# Patient Record
Sex: Female | Born: 1960 | Race: White | Hispanic: No | Marital: Married | Smoking: Former smoker
Health system: Southern US, Community
[De-identification: ages and names within clinical notes are randomized; demographics above are authoritative.]

## PROBLEM LIST (undated history)

## (undated) DIAGNOSIS — G43909 Migraine, unspecified, not intractable, without status migrainosus: Secondary | ICD-10-CM

## (undated) DIAGNOSIS — F329 Major depressive disorder, single episode, unspecified: Secondary | ICD-10-CM

## (undated) DIAGNOSIS — R002 Palpitations: Secondary | ICD-10-CM

## (undated) DIAGNOSIS — F32A Depression, unspecified: Secondary | ICD-10-CM

## (undated) DIAGNOSIS — N200 Calculus of kidney: Secondary | ICD-10-CM

## (undated) DIAGNOSIS — I10 Essential (primary) hypertension: Secondary | ICD-10-CM

## (undated) HISTORY — DX: Essential (primary) hypertension: I10

## (undated) HISTORY — DX: Palpitations: R00.2

## (undated) HISTORY — DX: Depression, unspecified: F32.A

## (undated) HISTORY — DX: Major depressive disorder, single episode, unspecified: F32.9

---

## 2010-12-04 HISTORY — PX: REDUCTION MAMMAPLASTY: SUR839

## 2010-12-04 HISTORY — PX: BREAST REDUCTION SURGERY: SHX8

## 2011-12-05 HISTORY — PX: NOVASURE ABLATION: SHX5394

## 2016-06-02 ENCOUNTER — Ambulatory Visit (INDEPENDENT_AMBULATORY_CARE_PROVIDER_SITE_OTHER): Payer: 59 | Admitting: Gynecology

## 2016-06-02 ENCOUNTER — Encounter: Payer: Self-pay | Admitting: Gynecology

## 2016-06-02 VITALS — BP 128/80 | Ht 62.0 in | Wt 170.0 lb

## 2016-06-02 DIAGNOSIS — N951 Menopausal and female climacteric states: Secondary | ICD-10-CM | POA: Diagnosis not present

## 2016-06-02 DIAGNOSIS — Z01419 Encounter for gynecological examination (general) (routine) without abnormal findings: Secondary | ICD-10-CM

## 2016-06-02 NOTE — Patient Instructions (Addendum)
Hormone Therapy At menopause, your body begins making less estrogen and progesterone hormones. This causes the body to stop having menstrual periods. This is because estrogen and progesterone hormones control your periods and menstrual cycle. A lack of estrogen may cause symptoms such as:  Hot flushes (or hot flashes).  Vaginal dryness.  Dry skin.  Loss of sex drive.  Risk of bone loss (osteoporosis). When this happens, you may choose to take hormone therapy to get back the estrogen lost during menopause. When the hormone estrogen is given alone, it is usually referred to as ET (Estrogen Therapy). When the hormone progestin is combined with estrogen, it is generally called HT (Hormone Therapy). This was formerly known as hormone replacement therapy (HRT). Your caregiver can help you make a decision on what will be best for you. The decision to use HT seems to change often as new studies are done. Many studies do not agree on the benefits of hormone replacement therapy. LIKELY BENEFITS OF HT INCLUDE PROTECTION FROM:  Hot Flushes (also called hot flashes) - A hot flush is a sudden feeling of heat that spreads over the face and body. The skin may redden like a blush. It is connected with sweats and sleep disturbance. Women going through menopause may have hot flushes a few times a month or several times per day depending on the woman.  Osteoporosis (bone loss) - Estrogen helps guard against bone loss. After menopause, a woman's bones slowly lose calcium and become weak and brittle. As a result, bones are more likely to break. The hip, wrist, and spine are affected most often. Hormone therapy can help slow bone loss after menopause. Weight bearing exercise and taking calcium with vitamin D also can help prevent bone loss. There are also medications that your caregiver can prescribe that can help prevent osteoporosis.  Vaginal dryness - Loss of estrogen causes changes in the vagina. Its lining may  become thin and dry. These changes can cause pain and bleeding during sexual intercourse. Dryness can also lead to infections. This can cause burning and itching. (Vaginal estrogen treatment can help relieve pain, itching, and dryness.)  Urinary tract infections are more common after menopause because of lack of estrogen. Some women also develop urinary incontinence because of low estrogen levels in the vagina and bladder.  Possible other benefits of estrogen include a positive effect on mood and short-term memory in women. RISKS AND COMPLICATIONS  Using estrogen alone without progesterone causes the lining of the uterus to grow. This increases the risk of lining of the uterus (endometrial) cancer. Your caregiver should give another hormone called progestin if you have a uterus.  Women who take combined (estrogen and progestin) HT appear to have an increased risk of breast cancer. The risk appears to be small, but increases throughout the time that HT is taken.  Combined therapy also makes the breast tissue slightly denser which makes it harder to read mammograms (breast X-rays).  Combined, estrogen and progesterone therapy can be taken together every day, in which case there may be spotting of blood. HT therapy can be taken cyclically in which case you will have menstrual periods. Cyclically means HT is taken for a set amount of days, then not taken, then this process is repeated.  HT may increase the risk of stroke, heart attack, breast cancer and forming blood clots in your leg.  Transdermal estrogen (estrogen that is absorbed through the skin with a patch or a cream) may have better results with:  Cholesterol.  Blood pressure.  Blood clots. Having the following conditions may indicate you should not have HT:  Endometrial cancer.  Liver disease.  Breast cancer.  Heart disease.  History of blood clots.  Stroke. TREATMENT   If you choose to take HT and have a uterus, usually  estrogen and progestin are prescribed.  Your caregiver will help you decide the best way to take the medications.  Possible ways to take estrogen include:  Pills.  Patches.  Gels.  Sprays.  Vaginal estrogen cream, rings and tablets.  It is best to take the lowest dose possible that will help your symptoms and take them for the shortest period of time that you can.  Hormone therapy can help relieve some of the problems (symptoms) that affect women at menopause. Before making a decision about HT, talk to your caregiver about what is best for you. Be well informed and comfortable with your decisions. HOME CARE INSTRUCTIONS   Follow your caregivers advice when taking the medications.  A Pap test is done to screen for cervical cancer.  The first Pap test should be done at age 34.  Between ages 80 and 52, Pap tests are repeated every 2 years.  Beginning at age 13, you are advised to have a Pap test every 3 years as long as the past 3 Pap tests have been normal.  Some women have medical problems that increase the chance of getting cervical cancer. Talk to your caregiver about these problems. It is especially important to talk to your caregiver if a new problem develops soon after your last Pap test. In these cases, your caregiver may recommend more frequent screening and Pap tests.  The above recommendations are the same for women who have or have not gotten the vaccine for HPV (human papillomavirus).  If you had a hysterectomy for a problem that was not a cancer or a condition that could lead to cancer, then you no longer need Pap tests. However, even if you no longer need a Pap test, a regular exam is a good idea to make sure no other problems are starting.  If you are between ages 20 and 60, and you have had normal Pap tests going back 10 years, you no longer need Pap tests. However, even if you no longer need a Pap test, a regular exam is a good idea to make sure no other problems  are starting.  If you have had past treatment for cervical cancer or a condition that could lead to cancer, you need Pap tests and screening for cancer for at least 20 years after your treatment.  If Pap tests have been discontinued, risk factors (such as a new sexual partner)need to be re-assessed to determine if screening should be resumed.  Some women may need screenings more often if they are at high risk for cervical cancer.  Get mammograms done as per the advice of your caregiver. SEEK IMMEDIATE MEDICAL CARE IF:  You develop abnormal vaginal bleeding.  You have pain or swelling in your legs, shortness of breath, or chest pain.  You develop dizziness or headaches.  You have lumps or changes in your breasts or armpits.  You have slurred speech.  You develop weakness or numbness of your arms or legs.  You have pain, burning, or bleeding when urinating.  You develop abdominal pain.   This information is not intended to replace advice given to you by your health care provider. Make sure you discuss any questions  you have with your health care provider.   Document Released: 08/19/2003 Document Revised: 04/06/2015 Document Reviewed: 05/24/2015 Elsevier Interactive Patient Education 2016 [place]. Menopause Menopause is the normal time of life when menstrual periods stop completely. Menopause is complete when you have missed 12 consecutive menstrual periods. It usually occurs between the ages of 49 years and 71 years. Very rarely does a woman develop menopause before the age of 46 years. At menopause, your ovaries stop producing the female hormones estrogen and progesterone. This can cause undesirable symptoms and also affect your health. Sometimes the symptoms may occur 4-5 years before the menopause begins. There is no relationship between menopause and:  Oral contraceptives.  Number of children you had.  Race.  The age your menstrual periods started (menarche). Heavy  smokers and very thin women may develop menopause earlier in life. CAUSES  The ovaries stop producing the female hormones estrogen and progesterone.  Other causes include:  Surgery to remove both ovaries.  The ovaries stop functioning for no known reason.  Tumors of the pituitary gland in the brain.  Medical disease that affects the ovaries and hormone production.  Radiation treatment to the abdomen or pelvis.  Chemotherapy that affects the ovaries. SYMPTOMS   Hot flashes.  Night sweats.  Decrease in sex drive.  Vaginal dryness and thinning of the vagina causing painful intercourse.  Dryness of the skin and developing wrinkles.  Headaches.  Tiredness.  Irritability.  Memory problems.  Weight gain.  Bladder infections.  Hair growth of the face and chest.  Infertility. More serious symptoms include:  Loss of bone (osteoporosis) causing breaks (fractures).  Depression.  Hardening and narrowing of the arteries (atherosclerosis) causing heart attacks and strokes. DIAGNOSIS   When the menstrual periods have stopped for 12 straight months.  Physical exam.  Hormone studies of the blood. TREATMENT  There are many treatment choices and nearly as many questions about them. The decisions to treat or not to treat menopausal changes is an individual choice made with your health care provider. Your health care provider can discuss the treatments with you. Together, you can decide which treatment will work best for you. Your treatment choices may include:   Hormone therapy (estrogen and progesterone).  Non-hormonal medicines.  Treating the individual symptoms with medicine (for example antidepressants for depression).  Herbal medicines that may help specific symptoms.  Counseling by a psychiatrist or psychologist.  Group therapy.  Lifestyle changes including:  Eating healthy.  Regular exercise.  Limiting caffeine and alcohol.  Stress management and  meditation.  No treatment. HOME CARE INSTRUCTIONS   Take the medicine your health care provider gives you as directed.  Get plenty of sleep and rest.  Exercise regularly.  Eat a diet that contains calcium (good for the bones) and soy products (acts like estrogen hormone).  Avoid alcoholic beverages.  Do not smoke.  If you have hot flashes, dress in layers.  Take supplements, calcium, and vitamin D to strengthen bones.  You can use over-the-counter lubricants or moisturizers for vaginal dryness.  Group therapy is sometimes very helpful.  Acupuncture may be helpful in some cases. SEEK MEDICAL CARE IF:   You are not sure you are in menopause.  You are having menopausal symptoms and need advice and treatment.  You are still having menstrual periods after age 49 years.  You have pain with intercourse.  Menopause is complete (no menstrual period for 12 months) and you develop vaginal bleeding.  You need a referral to  a specialist (gynecologist, psychiatrist, or psychologist) for treatment. SEEK IMMEDIATE MEDICAL CARE IF:   You have severe depression.  You have excessive vaginal bleeding.  You fell and think you have a broken bone.  You have pain when you urinate.  You develop leg or chest pain.  You have a fast pounding heart beat (palpitations).  You have severe headaches.  You develop vision problems.  You feel a lump in your breast.  You have abdominal pain or severe indigestion.   This information is not intended to replace advice given to you by your health care provider. Make sure you discuss any questions you have with your health care provider.   Document Released: 02/10/2004 Document Revised: 07/23/2013 Document Reviewed: 06/19/2013 Elsevier Interactive Patient Education [employer].  Colonoscopy A colonoscopy is an exam to look at the entire large intestine (colon). This exam can help find problems such as tumors, polyps, inflammation, and  areas of bleeding. The exam takes about 1 hour.  LET [facility] CARE PROVIDER KNOW ABOUT:   Any allergies you have.  All medicines you are taking, including vitamins, herbs, eye drops, creams, and over-the-counter medicines.  Previous problems you or members of your family have had with the use of anesthetics.  Any blood disorders you have.  Previous surgeries you have had.  Medical conditions you have. RISKS AND COMPLICATIONS  Generally, this is a safe procedure. However, as with any procedure, complications can occur. Possible complications include:  Bleeding.  Tearing or rupture of the colon wall.  Reaction to medicines given during the exam.  Infection (rare). BEFORE THE PROCEDURE   Ask your health care provider about changing or stopping your regular medicines.  You may be prescribed an oral bowel prep. This involves drinking a large amount of medicated liquid, starting the day before your procedure. The liquid will cause you to have multiple loose stools until your stool is almost clear or light green. This cleans out your colon in preparation for the procedure.  Do not eat or drink anything else once you have started the bowel prep, unless your health care provider tells you it is safe to do so.  Arrange for someone to drive you home after the procedure. PROCEDURE   You will be given medicine to help you relax (sedative).  You will lie on your side with your knees bent.  A long, flexible tube with a light and camera on the end (colonoscope) will be inserted through the rectum and into the colon. The camera sends video back to a computer screen as it moves through the colon. The colonoscope also releases carbon dioxide gas to inflate the colon. This helps your health care provider see the area better.  During the exam, your health care provider may take a small tissue sample (biopsy) to be examined under a microscope if any abnormalities are found.  The exam is  finished when the entire colon has been viewed. AFTER THE PROCEDURE   Do not drive for 24 hours after the exam.  You may have a small amount of blood in your stool.  You may pass moderate amounts of gas and have mild abdominal cramping or bloating. This is caused by the gas used to inflate your colon during the exam.  Ask when your test results will be ready and how you will get your results. Make sure you get your test results.   This information is not intended to replace advice given to you by your health  care provider. Make sure you discuss any questions you have with your health care provider.   Document Released: 11/17/2000 Document Revised: 09/10/2013 Document Reviewed: 07/28/2013 Elsevier Interactive Patient Education [employer].

## 2016-06-02 NOTE — Progress Notes (Signed)
Ariel Lopez [birth year] [id number]   History:    55 y.o.  for annual gyn exam who presented to the office today stating that she has been complaining hot flashes, mood swings and irritability. She has moved to [facility] from [place] where she had her last gynecological exam over year ago. She reports that she had a NovaSure endometrial ablation for her menorrhagia back in 2013 and has not had a cycle since then. She was also following a holistic medicine physician in [place] and recently had seen Dr. Sharol Roussel who recently did a battery of tests and hormone level testing which we do not have the report. She reports her last normal mammogram in 2015. She has not had a colonoscopy as of yet. She reports a mother and sister with colorectal cancer and a sister with cervical cancer at the age of 61. Also 2 aunts with history of breast cancer.  Past medical history,surgical history, family history and social history were all reviewed and documented in the EPIC chart.  Gynecologic History No LMP recorded. Patient has had an ablation. Contraception: vasectomy Last Pap: 2015. Results were: normal Last mammogram: 2015. Results were: normal  Obstetric History OB History  Gravida Para Term Preterm AB SAB TAB Ectopic Multiple Living  2       0  2    # Outcome Date GA Lbr Len/2nd Weight Sex Delivery Anes PTL Lv  2 Gravida           1 Gravida                ROS: A ROS was performed and pertinent positives and negatives are included in the history.  GENERAL: No fevers or chills. HEENT: No change in vision, no earache, sore throat or sinus congestion. NECK: No pain or stiffness. CARDIOVASCULAR: No chest pain or pressure. No palpitations. PULMONARY: No shortness of breath, cough or wheeze. GASTROINTESTINAL: No abdominal pain, nausea, vomiting or diarrhea, melena or bright red blood per rectum. GENITOURINARY: No urinary frequency, urgency, hesitancy or dysuria. MUSCULOSKELETAL: No  joint or muscle pain, no back pain, no recent trauma. DERMATOLOGIC: No rash, no itching, no lesions. ENDOCRINE: No polyuria, polydipsia, no heat or cold intolerance. No recent change in weight. HEMATOLOGICAL: No anemia or easy bruising or bleeding. NEUROLOGIC: No headache, seizures, numbness, tingling or weakness. PSYCHIATRIC: No depression, no loss of interest in normal activity or change in sleep pattern.     Exam: chaperone present  BP 128/80 mmHg  Ht 5\' 2"  (1.575 m)  Wt 170 lb (77.111 kg)  BMI 31.09 kg/m2  Body mass index is 31.09 kg/(m^2).  General appearance : Well developed well nourished female. No acute distress HEENT: Eyes: no retinal hemorrhage or exudates,  Neck supple, trachea midline, no carotid bruits, no thyroidmegaly Lungs: Clear to auscultation, no rhonchi or wheezes, or rib retractions  Heart: Regular rate and rhythm, no murmurs or gallops Breast:Examined in sitting and supine position were symmetrical in appearance, no palpable masses or tenderness,  no skin retraction, no nipple inversion, no nipple discharge, no skin discoloration, no axillary or supraclavicular lymphadenopathy Abdomen: no palpable masses or tenderness, no rebound or guarding Extremities: no edema or skin discoloration or tenderness  Pelvic:  Bartholin, Urethra, Skene Glands: Within normal limits             Vagina: No gross lesions or discharge  Cervix: No gross lesions or discharge  Uterus  anteverted, normal size, shape and consistency, non-tender and mobile  Adnexa  Without masses or tenderness  Anus and perineum  normal   Rectovaginal  normal sphincter tone without palpated masses or tenderness             Hemoccult will schedule colonoscopy this year     Assessment/Plan:  55 y.o. female for annual exam who signs and symptoms are suspicious for being menopausal at the age of 45. Since her holistic medicine physician has recently done a battery of blood tests including hormone levels no  blood tests will be done today. I've asked her to send [place] a copy of those lab results so that we can review and scanned in place and her records. We discussed importance of calcium vitamin D and weightbearing exercises for osteoporosis prevention. Pap smear with HPV screening was done today. And requisition to schedule her overdue mammogram was provided. Also I provided with names of community gastroenterologist for her to schedule an appointment.   Terrance Mass MD, 11:41 AM 06/02/2016

## 2016-06-07 LAB — PAP, TP IMAGING W/ HPV RNA, RFLX HPV TYPE 16,18/45: HPV mRNA, High Risk: NOT DETECTED

## 2016-09-22 ENCOUNTER — Other Ambulatory Visit: Payer: Self-pay | Admitting: Gynecology

## 2016-09-22 DIAGNOSIS — Z1231 Encounter for screening mammogram for malignant neoplasm of breast: Secondary | ICD-10-CM

## 2016-10-06 ENCOUNTER — Ambulatory Visit
Admission: RE | Admit: 2016-10-06 | Discharge: 2016-10-06 | Disposition: A | Discharge status: Home / Self Care | Payer: 59 | Source: Ambulatory Visit | Attending: Gynecology | Admitting: Gynecology

## 2016-10-06 DIAGNOSIS — Z1231 Encounter for screening mammogram for malignant neoplasm of breast: Secondary | ICD-10-CM

## 2017-02-08 DIAGNOSIS — Z0000 Encounter for general adult medical examination without abnormal findings: Secondary | ICD-10-CM | POA: Diagnosis not present

## 2017-02-08 DIAGNOSIS — Z832 Family history of diseases of the blood and blood-forming organs and certain disorders involving the immune mechanism: Secondary | ICD-10-CM | POA: Diagnosis not present

## 2017-02-08 LAB — BASIC METABOLIC PANEL
BUN: 9 (ref 4–21)
CREATININE: 0.6 (ref 0.5–1.1)
GLUCOSE: 96
POTASSIUM: 4.7 (ref 3.4–5.3)
SODIUM: 143 (ref 137–147)

## 2017-02-08 LAB — CBC AND DIFFERENTIAL
HCT: 47 — AB (ref 36–46)
Hemoglobin: 16.5 — AB (ref 12.0–16.0)
Platelets: 227 (ref 150–399)
WBC: 4.7

## 2017-02-08 LAB — LIPID PANEL
Cholesterol: 166 (ref 0–200)
HDL: 66 (ref 35–70)
LDL CALC: 78
Triglycerides: 106 (ref 40–160)

## 2017-02-08 LAB — HEPATIC FUNCTION PANEL
ALT: 26 (ref 7–35)
AST: 23 (ref 13–35)
Alkaline Phosphatase: 73 (ref 25–125)

## 2017-02-08 LAB — TSH: TSH: 0.58 (ref 0.41–5.90)

## 2017-03-15 DIAGNOSIS — K9041 Non-celiac gluten sensitivity: Secondary | ICD-10-CM | POA: Diagnosis not present

## 2017-03-15 DIAGNOSIS — Z1211 Encounter for screening for malignant neoplasm of colon: Secondary | ICD-10-CM | POA: Diagnosis not present

## 2017-03-15 DIAGNOSIS — Z800 Family history of malignant neoplasm of digestive organs: Secondary | ICD-10-CM | POA: Diagnosis not present

## 2017-03-15 DIAGNOSIS — Z7689 Persons encountering health services in other specified circumstances: Secondary | ICD-10-CM | POA: Diagnosis not present

## 2017-03-20 DIAGNOSIS — D485 Neoplasm of uncertain behavior of skin: Secondary | ICD-10-CM | POA: Diagnosis not present

## 2017-03-20 DIAGNOSIS — D225 Melanocytic nevi of trunk: Secondary | ICD-10-CM | POA: Diagnosis not present

## 2017-03-20 DIAGNOSIS — D2261 Melanocytic nevi of right upper limb, including shoulder: Secondary | ICD-10-CM | POA: Diagnosis not present

## 2017-03-20 DIAGNOSIS — D2272 Melanocytic nevi of left lower limb, including hip: Secondary | ICD-10-CM | POA: Diagnosis not present

## 2017-03-20 DIAGNOSIS — D2262 Melanocytic nevi of left upper limb, including shoulder: Secondary | ICD-10-CM | POA: Diagnosis not present

## 2017-04-09 DIAGNOSIS — D582 Other hemoglobinopathies: Secondary | ICD-10-CM | POA: Diagnosis not present

## 2017-04-09 DIAGNOSIS — Z8639 Personal history of other endocrine, nutritional and metabolic disease: Secondary | ICD-10-CM | POA: Diagnosis not present

## 2017-04-09 DIAGNOSIS — R002 Palpitations: Secondary | ICD-10-CM | POA: Diagnosis not present

## 2017-04-09 DIAGNOSIS — R899 Unspecified abnormal finding in specimens from other organs, systems and tissues: Secondary | ICD-10-CM | POA: Diagnosis not present

## 2017-04-18 ENCOUNTER — Encounter: Payer: Self-pay | Admitting: Gynecology

## 2017-07-04 DIAGNOSIS — Z1211 Encounter for screening for malignant neoplasm of colon: Secondary | ICD-10-CM | POA: Diagnosis not present

## 2017-07-04 DIAGNOSIS — K635 Polyp of colon: Secondary | ICD-10-CM | POA: Diagnosis not present

## 2017-07-04 DIAGNOSIS — Z800 Family history of malignant neoplasm of digestive organs: Secondary | ICD-10-CM | POA: Diagnosis not present

## 2017-07-04 DIAGNOSIS — D124 Benign neoplasm of descending colon: Secondary | ICD-10-CM | POA: Diagnosis not present

## 2017-08-10 ENCOUNTER — Ambulatory Visit (INDEPENDENT_AMBULATORY_CARE_PROVIDER_SITE_OTHER): Payer: 59 | Admitting: Obstetrics & Gynecology

## 2017-08-10 ENCOUNTER — Telehealth: Payer: Self-pay | Admitting: *Deleted

## 2017-08-10 ENCOUNTER — Encounter: Payer: Self-pay | Admitting: Obstetrics & Gynecology

## 2017-08-10 VITALS — BP 146/90

## 2017-08-10 DIAGNOSIS — N951 Menopausal and female climacteric states: Secondary | ICD-10-CM | POA: Diagnosis not present

## 2017-08-10 DIAGNOSIS — E6609 Other obesity due to excess calories: Secondary | ICD-10-CM | POA: Diagnosis not present

## 2017-08-10 DIAGNOSIS — Z6831 Body mass index (BMI) 31.0-31.9, adult: Secondary | ICD-10-CM | POA: Diagnosis not present

## 2017-08-10 NOTE — Telephone Encounter (Signed)
Pt was seen today and Rx was never sent for Estradiol 0.05 patch and Micronized Progesterone 100 mg PO HS. What estradiol 0.05 mg patch would you like patient to have biweekly or weekly patch? Please advise

## 2017-08-10 NOTE — Patient Instructions (Signed)
1. Menopause syndrome Very symptomatic menopause.  No CI to HRT.  Benefits vs Risks of HRT discussed thoroughly with patient.  Benefits include control of Vasomotor symptoms, prevention of Atrophic vaginitis, slowing down Bone loss, prevention of MI, skin, mood, libido.  Risks include increased risk of blood clots/stroke and after 10 years of use, increased risk of breast Ca.  Patient understands the risks/benefits and opts to start on HRT.  Forms of HRT discussed, recommend Estradiol patch at the lowest dosage that will help patient with her symptoms, will start with Estradiol 0.05 patch and Micronized Progesterone 100 mg PO HS to protect her Uterine lining.  Schedule Bone Density.  Continue with Vit D supplements/Ca++ in nutrition, weight bearing physical activity.  2. Class 1 obesity due to excess calories without serious comorbidity with body mass index (BMI) of 31.0 to 31.9 in adult Low calorie/low carb diet discussed as well as regular physical activity.  Recommend Cardio and weight lifting.  Amiee, it was a pleasure to meet you today!  I will see you again soon at your Annual/Gyn exam.   Exercising to Lose Weight Exercising can help you to lose weight. In order to lose weight through exercise, you need to do vigorous-intensity exercise. You can tell that you are exercising with vigorous intensity if you are breathing very hard and fast and cannot hold a conversation while exercising. Moderate-intensity exercise helps to maintain your current weight. You can tell that you are exercising at a moderate level if you have a higher heart rate and faster breathing, but you are still able to hold a conversation. How often should I exercise? Choose an activity that you enjoy and set realistic goals. Your health care provider can help you to make an activity plan that works for you. Exercise regularly as directed by your health care provider. This may include:  Doing resistance training twice each week,  such as: ? Push-ups. ? Sit-ups. ? Lifting weights. ? Using resistance bands.  Doing a given intensity of exercise for a given amount of time. Choose from these options: ? 150 minutes of moderate-intensity exercise every week. ? 75 minutes of vigorous-intensity exercise every week. ? A mix of moderate-intensity and vigorous-intensity exercise every week.  Children, pregnant women, people who are out of shape, people who are overweight, and older adults may need to consult a health care provider for individual recommendations. If you have any sort of medical condition, be sure to consult your health care provider before starting a new exercise program. What are some activities that can help me to lose weight?  Walking at a rate of at least 4.5 miles an hour.  Jogging or running at a rate of 5 miles per hour.  Biking at a rate of at least 10 miles per hour.  Lap swimming.  Roller-skating or in-line skating.  Cross-country skiing.  Vigorous competitive sports, such as football, basketball, and soccer.  Jumping rope.  Aerobic dancing. How can I be more active in my day-to-day activities?  Use the stairs instead of the elevator.  Take a walk during your lunch break.  If you drive, park your car farther away from work or school.  If you take public transportation, get off one stop early and walk the rest of the way.  Make all of your phone calls while standing up and walking around.  Get up, stretch, and walk around every 30 minutes throughout the day. What guidelines should I follow while exercising?  Do not exercise  so much that you hurt yourself, feel dizzy, or get very short of breath.  Consult your health care provider prior to starting a new exercise program.  Wear comfortable clothes and shoes with good support.  Drink plenty of water while you exercise to prevent dehydration or heat stroke. Body water is lost during exercise and must be replaced.  Work out until  you breathe faster and your heart beats faster. This information is not intended to replace advice given to you by your health care provider. Make sure you discuss any questions you have with your health care provider. Document Released: 12/23/2010 Document Revised: 04/27/2016 Document Reviewed: 04/23/2014 Elsevier Interactive Patient Education  2018 [place] for [employer] Calories are units of energy. Your body needs a certain amount of calories from food to keep you going throughout the day. When you eat more calories than your body needs, your body stores the extra calories as fat. When you eat fewer calories than your body needs, your body burns fat to get the energy it needs. Calorie counting means keeping track of how many calories you eat and drink each day. Calorie counting can be helpful if you need to lose weight. If you make sure to eat fewer calories than your body needs, you should lose weight. Ask your health care provider what a healthy weight is for you. For calorie counting to work, you will need to eat the right number of calories in a day in order to lose a healthy amount of weight per week. A dietitian can help you determine how many calories you need in a day and will give you suggestions on how to reach your calorie goal.  A healthy amount of weight to lose per week is usually 1-2 lb (0.5-0.9 kg). This usually means that your daily calorie intake should be reduced by 500-750 calories.  Eating 1,200 - 1,500 calories per day can help most women lose weight.  Eating 1,500 - 1,800 calories per day can help most men lose weight.  What is my plan? My goal is to have __________ calories per day. If I have this many calories per day, I should lose around __________ pounds per week. What do I need to know about calorie counting? In order to meet your daily calorie goal, you will need to:  Find out how many calories are in each food you would like to eat.  Try to do this before you eat.  Decide how much of the food you plan to eat.  Write down what you ate and how many calories it had. Doing this is called keeping a food log.  To successfully lose weight, it is important to balance calorie counting with a healthy lifestyle that includes regular activity. Aim for 150 minutes of moderate exercise (such as walking) or 75 minutes of vigorous exercise (such as running) each week. Where do I find calorie information?  The number of calories in a food can be found on a Nutrition Facts label. If a food does not have a Nutrition Facts label, try to look up the calories online or ask your dietitian for help. Remember that calories are listed per serving. If you choose to have more than one serving of a food, you will have to multiply the calories per serving by the amount of servings you plan to eat. For example, the label on a package of bread might say that a serving size is 1 slice and that there are  90 calories in a serving. If you eat 1 slice, you will have eaten 90 calories. If you eat 2 slices, you will have eaten 180 calories. How do I keep a food log? Immediately after each meal, record the following information in your food log:  What you ate. Don't forget to include toppings, sauces, and other extras on the food.  How much you ate. This can be measured in cups, ounces, or number of items.  How many calories each food and drink had.  The total number of calories in the meal.  Keep your food log near you, such as in a small notebook in your pocket, or use a mobile app or website. Some programs will calculate calories for you and show you how many calories you have left for the day to meet your goal. What are some calorie counting tips?  Use your calories on foods and drinks that will fill you up and not leave you hungry: ? Some examples of foods that fill you up are nuts and nut butters, vegetables, lean proteins, and high-fiber foods like  whole grains. High-fiber foods are foods with more than 5 g fiber per serving. ? Drinks such as sodas, specialty coffee drinks, alcohol, and juices have a lot of calories, yet do not fill you up.  Eat nutritious foods and avoid empty calories. Empty calories are calories you get from foods or beverages that do not have many vitamins or protein, such as candy, sweets, and soda. It is better to have a nutritious high-calorie food (such as an avocado) than a food with few nutrients (such as a bag of chips).  Know how many calories are in the foods you eat most often. This will help you calculate calorie counts faster.  Pay attention to calories in drinks. Low-calorie drinks include water and unsweetened drinks.  Pay attention to nutrition labels for "low fat" or "fat free" foods. These foods sometimes have the same amount of calories or more calories than the full fat versions. They also often have added sugar, starch, or salt, to make up for flavor that was removed with the fat.  Find a way of tracking calories that works for you. Get creative. Try different apps or programs if writing down calories does not work for you. What are some portion control tips?  Know how many calories are in a serving. This will help you know how many servings of a certain food you can have.  Use a measuring cup to measure serving sizes. You could also try weighing out portions on a kitchen scale. With time, you will be able to estimate serving sizes for some foods.  Take some time to put servings of different foods on your favorite plates, bowls, and cups so you know what a serving looks like.  Try not to eat straight from a bag or box. Doing this can lead to overeating. Put the amount you would like to eat in a cup or on a plate to make sure you are eating the right portion.  Use smaller plates, glasses, and bowls to prevent overeating.  Try not to multitask (for example, watch TV or use your computer) while  eating. If it is time to eat, sit down at a table and enjoy your food. This will help you to know when you are full. It will also help you to be aware of what you are eating and how much you are eating. What are tips for following this plan? Reading  food labels  Check the calorie count compared to the serving size. The serving size may be smaller than what you are used to eating.  Check the source of the calories. Make sure the food you are eating is high in vitamins and protein and low in saturated and trans fats. Shopping  Read nutrition labels while you shop. This will help you make healthy decisions before you decide to purchase your food.  Make a grocery list and stick to it. Cooking  Try to cook your favorite foods in a healthier way. For example, try baking instead of frying.  Use low-fat dairy products. Meal planning  Use more fruits and vegetables. Half of your plate should be fruits and vegetables.  Include lean proteins like poultry and fish. How do I count calories when eating out?  Ask for smaller portion sizes.  Consider sharing an entree and sides instead of getting your own entree.  If you get your own entree, eat only half. Ask for a box at the beginning of your meal and put the rest of your entree in it so you are not tempted to eat it.  If calories are listed on the menu, choose the lower calorie options.  Choose dishes that include vegetables, fruits, whole grains, low-fat dairy products, and lean protein.  Choose items that are boiled, broiled, grilled, or steamed. Stay away from items that are buttered, battered, fried, or served with cream sauce. Items labeled "crispy" are usually fried, unless stated otherwise.  Choose water, low-fat milk, unsweetened iced tea, or other drinks without added sugar. If you want an alcoholic beverage, choose a lower calorie option such as a glass of wine or light beer.  Ask for dressings, sauces, and syrups on the side.  These are usually high in calories, so you should limit the amount you eat.  If you want a salad, choose a garden salad and ask for grilled meats. Avoid extra toppings like bacon, cheese, or fried items. Ask for the dressing on the side, or ask for olive oil and vinegar or lemon to use as dressing.  Estimate how many servings of a food you are given. For example, a serving of cooked rice is  cup or about the size of half a baseball. Knowing serving sizes will help you be aware of how much food you are eating at restaurants. The list below tells you how big or small some common portion sizes are based on everyday objects: ? 1 oz-4 stacked dice. ? 3 oz-1 deck of cards. ? 1 tsp-1 die. ? 1 Tbsp- a ping-pong ball. ? 2 Tbsp-1 ping-pong ball. ?  cup- baseball. ? 1 cup-1 baseball. Summary  Calorie counting means keeping track of how many calories you eat and drink each day. If you eat fewer calories than your body needs, you should lose weight.  A healthy amount of weight to lose per week is usually 1-2 lb (0.5-0.9 kg). This usually means reducing your daily calorie intake by 500-750 calories.  The number of calories in a food can be found on a Nutrition Facts label. If a food does not have a Nutrition Facts label, try to look up the calories online or ask your dietitian for help.  Use your calories on foods and drinks that will fill you up, and not on foods and drinks that will leave you hungry.  Use smaller plates, glasses, and bowls to prevent overeating. This information is not intended to replace advice given to you by  your health care provider. Make sure you discuss any questions you have with your health care provider. Document Released: 11/20/2005 Document Revised: 10/20/2016 Document Reviewed: 10/20/2016 Elsevier Interactive Patient Education  2017 [place].  Menopause and Hormone Replacement Therapy What is hormone replacement therapy? Hormone replacement therapy (HRT) is the  use of artificial (synthetic) hormones to replace hormones that your body stops producing during menopause. Menopause is the normal time of life when menstrual periods stop completely and the ovaries stop producing the female hormones estrogen and progesterone. This lack of hormones can affect your health and cause undesirable symptoms. HRT can relieve some of those symptoms. What are my options for HRT? HRT may consist of the synthetic hormones estrogen and progestin, or it may consist of only estrogen (estrogen-only therapy). You and your health care provider will decide which form of HRT is best for you. If you choose to be on HRT and you have a uterus, estrogen and progestin are usually prescribed. Estrogen-only therapy is used for women who do not have a uterus. Possible options for taking HRT include:  Pills.  Patches.  Gels.  Sprays.  Vaginal cream.  Vaginal rings.  Vaginal inserts.  The amount of hormone(s) that you take and how long you take the hormone(s) varies depending on your individual health. It is important to:  Begin HRT with the lowest possible dosage.  Stop HRT as soon as your health care provider tells you to stop.  Work with your health care provider so that you feel informed and comfortable with your decisions.  What are the benefits of HRT? HRT can reduce the frequency and severity of menopausal symptoms. Benefits of HRT vary depending on the menopausal symptoms that you have, the severity of your symptoms, and your overall health. HRT may help to improve the following menopausal symptoms:  Hot flashes and night sweats. These are sudden feelings of heat that spread over the face and body. The skin may turn red, like a blush. Night sweats are hot flashes that happen while you are sleeping or trying to sleep.  Bone loss (osteoporosis). The body loses calcium more quickly after menopause, causing the bones to become weaker. This can increase the risk for bone  breaks (fractures).  Vaginal dryness. The lining of the vagina can become thin and dry, which can cause pain during sexual intercourse or cause infection, burning, or itching.  Urinary tract infections.  Urinary incontinence. This is a decreased ability to control when you urinate.  Irritability.  Short-term memory problems.  What are the risks of HRT? Risks of HRT vary depending on your individual health and medical history. Risks of HRT also depend on whether you receive both estrogen and progestin or you receive estrogen only.HRT may increase the risk of:  Spotting. This is when a small amount of bloodleaks from the vagina unexpectedly.  Endometrial cancer. This cancer is in the lining of the uterus (endometrium).  Breast cancer.  Increased density of breast tissue. This can make it harder to find breast cancer on a breast X-ray (mammogram).  Stroke.  Heart attack.  Blood clots.  Gallbladder disease.  Risks of HRT can increase if you have any of the following conditions:  Endometrial cancer.  Liver disease.  Heart disease.  Breast cancer.  History of blood clots.  History of stroke.  How should I care for myself while I am on HRT?  Take over-the-counter and prescription medicines only as told by your health care provider.  Get mammograms,  pelvic exams, and medical checkups as often as told by your health care provider.  Have Pap tests done as often as told by your health care provider. A Pap test is sometimes called a Pap smear. It is a screening test that is used to check for signs of cancer of the cervix and vagina. A Pap test can also identify the presence of infection or precancerous changes. Pap tests may be done: ? Every 3 years, starting at age 78. ? Every 5 years, starting after age 17, in combination with testing for human papillomavirus (HPV). ? More often or less often depending on other medical conditions you have, your age, and other risk  factors.  It is your responsibility to get your Pap test results. Ask your health care provider or the department performing the test when your results will be ready.  Keep all follow-up visits as told by your health care provider. This is important. When should I seek medical care? Talk with your health care provider if:  You have any of these: ? Pain or swelling in your legs. ? Shortness of breath. ? Chest pain. ? Lumps or changes in your breasts or armpits. ? Slurred speech. ? Pain, burning, or bleeding when you urine.  You develop any of these: ? Unusual vaginal bleeding. ? Dizziness or headaches. ? Weakness or numbness in any part of your arms or legs. ? Pain in your abdomen.  This information is not intended to replace advice given to you by your health care provider. Make sure you discuss any questions you have with your health care provider. Document Released: 08/19/2003 Document Revised: 10/17/2016 Document Reviewed: 05/24/2015 Elsevier Interactive Patient Education  2017 [employer].

## 2017-08-10 NOTE — Progress Notes (Signed)
    Ariel Lopez [birth year] [id number]        56 y.o.  G2P0A2 Married  RP:  Severe hot flushes and night sweats  HPI:  Worsening severe hot flushes causing discomfort and embarassement  and night sweats keeping her awake most of the night.  [place] 05/2016 at 28.9.  S/P Endometrial Ablation in 2013, no bleeding since.  Low libido, but no pain with IC.  No Pelvic pain.  Last pap 05/2016 neg.  Breasts wnl.  Mammo neg 09/2016.  Colonoscopy <1 yr, benign polyp removed.   2 GM with Breast Ca.  Mother no cancer.  Sister Cervical Ca.  No Fam h/o Ovarian or Colon Ca.  TSH 02/2017 wnl.  CMP, FLP, CBC wnl.  Taking Vit D supplement.  BMI 31.9 last year.  Mother and sister pos for Factor V Leiden, patient is negative.  Past medical history,surgical history, problem list, medications, allergies, family history and social history were all reviewed and documented in the EPIC chart.  Directed ROS with pertinent positives and negatives documented in the history of present illness/assessment and plan.  Exam:  Vitals:   08/10/17 1311  BP: (!) 146/90   General appearance:  Normal  Deferred to Annual/Gyn exam  Assessment/Plan:  56 y.o. G2P0   1. Menopause syndrome Very symptomatic menopause.  No CI to HRT.  Benefits vs Risks of HRT discussed thoroughly with patient.  Benefits include control of Vasomotor symptoms, prevention of Atrophic vaginitis, slowing down Bone loss, prevention of MI, skin, mood, libido.  Risks include increased risk of blood clots/stroke and after 10 years of use, increased risk of breast Ca.  Patient understands the risks/benefits and opts to start on HRT.  Forms of HRT discussed, recommend Estradiol patch at the lowest dosage that will help patient with her symptoms, will start with Estradiol 0.05 patch and Micronized Progesterone 100 mg PO HS to protect her Uterine lining.  Schedule Bone Density.  Continue with Vit D supplements/Ca++ in nutrition, weight bearing physical activity.  2. Class  1 obesity due to excess calories without serious comorbidity with body mass index (BMI) of 31.0 to 31.9 in adult Low calorie/low carb diet discussed as well as regular physical activity.  Recommend Cardio and weight lifting.  Counseling on above issues >50% x 25 minutes  Princess Bruins MD, 1:24 PM 08/10/2017

## 2017-08-12 MED ORDER — PROGESTERONE MICRONIZED 100 MG PO CAPS: 100.0000 mg | ORAL_CAPSULE | Freq: Every day | ORAL | 4 refills | Status: DC

## 2017-08-12 MED ORDER — ESTRADIOL 0.05 MG/24HR TD PTWK: 0.0500 mg | MEDICATED_PATCH | TRANSDERMAL | 12 refills | Status: DC

## 2017-08-12 NOTE — Addendum Note (Signed)
Addended by: Princess Bruins on: 08/12/2017 07:57 AM   Modules accepted: Orders

## 2017-08-13 NOTE — Telephone Encounter (Signed)
Prescription sent Sunday am.

## 2017-08-22 ENCOUNTER — Encounter: Payer: 59 | Admitting: Obstetrics & Gynecology

## 2017-09-06 ENCOUNTER — Telehealth: Payer: Self-pay | Admitting: Obstetrics & Gynecology

## 2017-09-06 NOTE — Telephone Encounter (Signed)
Pt called stating she has had improvement with menopause symptoms since she has been  placed on estradiol patch 1 patch (0.05 mg total) onto the skin once a week.  Per pt,since visit hot flashes has decreased but still present. Would like to know if you recommend a stronger dose to relive reoccurring hot flashes.

## 2017-09-09 NOTE — Telephone Encounter (Signed)
Best is the lowest dose at which she is well, functional, happy... With tolerable hot flashes.  If her hot flashes are still bad enough to prevent her from feeling good about her life, we can increase to Estradiol 0.075.

## 2017-09-21 NOTE — Telephone Encounter (Signed)
Pt states she will call back with current pharmacy

## 2017-09-25 NOTE — Telephone Encounter (Signed)
Called pt. Left voice mail to return my call. Need correct pharmacy

## 2017-10-11 ENCOUNTER — Encounter: Payer: 59 | Admitting: Obstetrics & Gynecology

## 2017-10-20 ENCOUNTER — Emergency Department (HOSPITAL_COMMUNITY): Payer: 59

## 2017-10-20 ENCOUNTER — Encounter (HOSPITAL_COMMUNITY): Payer: Self-pay | Admitting: Emergency Medicine

## 2017-10-20 ENCOUNTER — Emergency Department (HOSPITAL_COMMUNITY)
Admission: EM | Admit: 2017-10-20 | Discharge: 2017-10-20 | Disposition: A | Discharge status: Home / Self Care | Payer: 59 | Attending: Emergency Medicine | Admitting: Emergency Medicine

## 2017-10-20 DIAGNOSIS — Z79899 Other long term (current) drug therapy: Secondary | ICD-10-CM | POA: Insufficient documentation

## 2017-10-20 DIAGNOSIS — Z87891 Personal history of nicotine dependence: Secondary | ICD-10-CM | POA: Diagnosis not present

## 2017-10-20 DIAGNOSIS — Z91010 Allergy to peanuts: Secondary | ICD-10-CM | POA: Diagnosis not present

## 2017-10-20 DIAGNOSIS — R002 Palpitations: Secondary | ICD-10-CM | POA: Insufficient documentation

## 2017-10-20 DIAGNOSIS — R202 Paresthesia of skin: Secondary | ICD-10-CM | POA: Diagnosis not present

## 2017-10-20 DIAGNOSIS — R200 Anesthesia of skin: Secondary | ICD-10-CM | POA: Diagnosis present

## 2017-10-20 DIAGNOSIS — R079 Chest pain, unspecified: Secondary | ICD-10-CM | POA: Diagnosis not present

## 2017-10-20 HISTORY — DX: Calculus of kidney: N20.0

## 2017-10-20 HISTORY — DX: Migraine, unspecified, not intractable, without status migrainosus: G43.909

## 2017-10-20 LAB — CBC WITH DIFFERENTIAL/PLATELET
Basophils Absolute: 0 10*3/uL (ref 0.0–0.1)
Basophils Relative: 0 %
EOS ABS: 0 10*3/uL (ref 0.0–0.7)
EOS PCT: 1 %
HCT: 43.3 % (ref 36.0–46.0)
Hemoglobin: 15.2 g/dL — ABNORMAL HIGH (ref 12.0–15.0)
LYMPHS ABS: 1.4 10*3/uL (ref 0.7–4.0)
Lymphocytes Relative: 29 %
MCH: 32.5 pg (ref 26.0–34.0)
MCHC: 35.1 g/dL (ref 30.0–36.0)
MCV: 92.5 fL (ref 78.0–100.0)
MONO ABS: 0.3 10*3/uL (ref 0.1–1.0)
MONOS PCT: 6 %
Neutro Abs: 3.1 10*3/uL (ref 1.7–7.7)
Neutrophils Relative %: 64 %
Platelets: 217 10*3/uL (ref 150–400)
RBC: 4.68 MIL/uL (ref 3.87–5.11)
RDW: 12.3 % (ref 11.5–15.5)
WBC: 4.9 10*3/uL (ref 4.0–10.5)

## 2017-10-20 LAB — BASIC METABOLIC PANEL
Anion gap: 7 (ref 5–15)
BUN: 8 mg/dL (ref 6–20)
CALCIUM: 9.3 mg/dL (ref 8.9–10.3)
CO2: 24 mmol/L (ref 22–32)
CREATININE: 0.72 mg/dL (ref 0.44–1.00)
Chloride: 108 mmol/L (ref 101–111)
GFR calc Af Amer: >60 mL/min (ref >60)
GFR calc non Af Amer: >60 mL/min (ref >60)
GLUCOSE: 100 mg/dL — AB (ref 65–99)
Potassium: 3.9 mmol/L (ref 3.5–5.1)
Sodium: 139 mmol/L (ref 135–145)

## 2017-10-20 LAB — TROPONIN I: Troponin I: <0.03 ng/mL (ref <0.03)

## 2017-10-20 LAB — D-DIMER, QUANTITATIVE: D-Dimer, Quant: <0.27 ug/mL-FEU (ref 0.00–0.50)

## 2017-10-20 MED ORDER — SODIUM CHLORIDE 0.9 % IV BOLUS (SEPSIS)
1000.0000 mL | Freq: Once | INTRAVENOUS | Status: AC
  Administered 2017-10-20: 1000 mL via INTRAVENOUS

## 2017-10-20 MED ORDER — ASPIRIN 81 MG PO CHEW
324.0000 mg | CHEWABLE_TABLET | Freq: Once | ORAL | Status: AC
  Administered 2017-10-20: 324 mg via ORAL
  Filled 2017-10-20: qty 4

## 2017-10-20 NOTE — ED Notes (Signed)
Pt transported to xray 

## 2017-10-20 NOTE — ED Notes (Signed)
Add on troponin

## 2017-10-20 NOTE — ED Notes (Signed)
RN will collect labs at IV start 

## 2017-10-20 NOTE — ED Triage Notes (Signed)
Patient c/o left arm numbness that started around 2am with heart racing. Patient reports that she had this happen last week end too and intermittent arm numbness but never lasting this long.  Patient reports that she is on hormone patch and if that is the cause.

## 2017-10-20 NOTE — ED Notes (Signed)
ED Provider at bedside. 

## 2017-10-20 NOTE — ED Provider Notes (Signed)
[facility] DEPT Provider Note   CSN: [id number] Arrival date & time: 10/20/17  0744     History   Chief Complaint Chief Complaint  Patient presents with  . arm numbness    HPI Ariel Lopez is a 56 y.o. female.  HPI  56 year old female presents with a chief complaint of arm numbness.  She states that around 2 AM she woke up with palpitations where her heart felt like it was racing and skipping beats.  She also noticed left forearm numbness.  This numbness has been persistent since 2 AM.  There is no associated weakness.  There is no numbness or weakness in any other extremity.  Feels like her arm is asleep.  Some tingling.    Thus my suspicion for TIA or stroke is at one point it also involved the upper arm but now it is mostly just the forearm on the ulnar aspect.  She states that she has had some intermittent sharp left-sided chest pain over her breast.  No shortness of breath.  These palpitations of happened to her before, most recently 1 week ago but has happened "for a while".  It might have something to do with when she drinks alcohol she states she had 3 glasses of wine last night.  She had wine the night before the episode 1 week ago as well.  However the numbness never seems to last this long.  No headache, dizziness, blurry vision. No current chest pain. She has been on estradiol for about 2 months and is concerned this might be a cause. However she notes these episodes started before being put on estradiol.  Past Medical History:  Diagnosis Date  . Kidney stone   . Migraine     There are no active problems to display for this patient.   Past Surgical History:  Procedure Laterality Date  . BREAST REDUCTION SURGERY  2012  . [place] ABLATION  2013    OB History    Gravida Para Term Preterm AB Living   2         2   SAB TAB Ectopic Multiple Live Births       0           Home Medications    Prior to Admission medications     Medication Sig Start Date End Date Taking? Authorizing Provider  acetaminophen (TYLENOL) 500 MG tablet Take 1,000 mg every 6 (six) hours as needed by mouth for moderate pain.   Yes [provider]  Cholecalciferol (VITAMIN D PO) Take 1 tablet by mouth daily.    Yes [provider]  estradiol (CLIMARA - DOSED IN MG/24 HR) 0.05 mg/24hr patch Place 1 patch (0.05 mg total) onto the skin once a week. 08/12/17  Yes Princess Bruins, MD  ibuprofen (ADVIL,MOTRIN) 200 MG tablet Take 400 mg every 6 (six) hours as needed by mouth for moderate pain.   Yes [provider]  MAGNESIUM PO Take 1 tablet by mouth daily.   Yes [provider]  Probiotic Product (PROBIOTIC PO) Take 1 tablet by mouth daily.   Yes [provider]  progesterone (PROMETRIUM) 100 MG capsule Take 1 capsule (100 mg total) by mouth daily. 08/12/17  Yes Princess Bruins, MD    Family History Family History  Problem Relation Age of Onset  . Breast cancer Maternal Grandmother   . Breast cancer Paternal Grandmother   . Cervical cancer Sister   . Colon cancer Maternal Aunt   .  Hypercholesterolemia Mother   . Hypertension Mother   . Hypercholesterolemia Father   . Hypertension Father     Social History Social History   Tobacco Use  . Smoking status: Former Research scientist (life sciences)  . Smokeless tobacco: Never Used  . Tobacco comment: SOCIAL SMOK[employer] COLLEGE   Substance Use Topics  . Alcohol use: Yes    Alcohol/week: 0.0 oz  . Drug use: No     Allergies   Gluten meal; Lac bovis; Peanut-containing drug products; and Wheat bran   Review of Systems Review of Systems  Constitutional: Negative for fever.  Respiratory: Negative for shortness of breath.   Cardiovascular: Positive for chest pain and palpitations.  Gastrointestinal: Negative for abdominal pain and vomiting.  Neurological: Positive for numbness. Negative for dizziness, weakness and headaches.  All other systems reviewed and are  negative.    Physical Exam Updated Vital Signs BP (!) 136/95   Pulse 67   Temp 98.3 F (36.8 C) (Oral)   Resp 17   Ht 5\' 2"  (1.575 m)   Wt 68 kg (150 lb)   SpO2 97%   BMI 27.44 kg/m   Physical Exam  Constitutional: She is oriented to person, place, and time. She appears well-developed and well-nourished. No distress.  HENT:  Head: Normocephalic and atraumatic.  Right Ear: External ear normal.  Left Ear: External ear normal.  Nose: Nose normal.  Eyes: Right eye exhibits no discharge. Left eye exhibits no discharge.  Cardiovascular: Normal rate, regular rhythm and normal heart sounds.  Pulses:      Radial pulses are 2+ on the right side, and 2+ on the left side.  Pulmonary/Chest: Effort normal and breath sounds normal.  Abdominal: Soft. There is no tenderness.  Neurological: She is alert and oriented to person, place, and time.  CN 3-12 grossly intact. 5/5 strength in all 4 extremities. Subjective decreased sensation to the radial aspect of the left forearm. Normal finger to nose.   Skin: Skin is warm and dry. She is not diaphoretic.  Nursing note and vitals reviewed.    ED Treatments / Results  Labs (all labs ordered are listed, but only abnormal results are displayed) Labs Reviewed  BASIC METABOLIC PANEL - Abnormal; Notable for the following components:      Result Value   Glucose, Bld 100 (*)    All other components within normal limits  CBC WITH DIFFERENTIAL/PLATELET - Abnormal; Notable for the following components:   Hemoglobin 15.2 (*)    All other components within normal limits  D-DIMER, QUANTITATIVE (NOT AT [facility])  TROPONIN I    EKG  EKG Interpretation  Date/Time:  Saturday October 20 2017 07:55:39 EST Ventricular Rate:  86 PR Interval:    QRS Duration: 97 QT Interval:  393 QTC Calculation: 471 R Axis:   69 Text Interpretation:  Sinus rhythm Anteroseptal infarct, old Borderline T abnormalities, anterior leads No old tracing to compare Confirmed by  Sherwood Gambler [id number]) on 10/20/2017 8:03:55 AM       Radiology Dg Chest 2 View  Result Date: 10/20/2017 CLINICAL DATA:  Chest pain. Tachycardia. Left arm numbness. Symptoms began at 2 a.m. EXAM: CHEST  2 VIEW COMPARISON:  None. FINDINGS: The heart size and mediastinal contours are within normal limits. Both lungs are clear. The visualized skeletal structures are unremarkable. IMPRESSION: Negative two view chest x-ray Electronically Signed   By: San Morelle M.D.   On: 10/20/2017 08:46    Procedures Procedures (including critical care time)  Medications Ordered in ED  Medications  sodium chloride 0.9 % bolus 1,000 mL (1,000 mLs Intravenous New Bag/Given 10/20/17 0906)  aspirin chewable tablet 324 mg (324 mg Oral Given 10/20/17 5397)     Initial Impression / Assessment and Plan / ED Course  I have reviewed the triage vital signs and the nursing notes.  Pertinent labs & imaging results that were available during my care of the patient were reviewed by me and considered in my medical decision making (see chart for details).     Patient's workup is unremarkable.  On reevaluation, the numbness/paresthesia appears to be gone. She tells me this is been an on and off issue for a couple years since she went into menopause.  It always occurs with palpitations and left arm numbness. Much less likely to be TIA/CVA given this and given that it is so focal in the forearm. Doubt ACS. Low suspicion for PE, but with estrogen use ddimer sent and is negative. This could be related to intermittent alcohol use given timing. I don't think emergent neuro or cardiac workup is otherwise needed. Her CP is atypical and has been ongoing for over 6 hours with negative troponin. Thus, plan to d/c home, avoid ETOH for now, follow up with cards for holter monitoring and further eval. Strict return precautions.   Final Clinical Impressions(s) / ED Diagnoses   Final diagnoses:  Palpitations  Arm  paresthesia, left    ED Discharge Orders    None       Sherwood Gambler, MD 10/20/17 1020

## 2017-11-01 ENCOUNTER — Telehealth: Payer: Self-pay | Admitting: *Deleted

## 2017-11-01 ENCOUNTER — Other Ambulatory Visit: Payer: Self-pay | Admitting: Obstetrics & Gynecology

## 2017-11-01 DIAGNOSIS — Z1231 Encounter for screening mammogram for malignant neoplasm of breast: Secondary | ICD-10-CM

## 2017-11-01 NOTE — Telephone Encounter (Signed)
Patient called c/o breast tingling,noticed breast pain/tingling when taking HRT patient has stopped HRT, and still has breast tingling. I advised pt to schedule OV with provider. Transferred to front desk to schedule.

## 2017-11-02 ENCOUNTER — Telehealth: Payer: Self-pay | Admitting: *Deleted

## 2017-11-02 ENCOUNTER — Encounter: Payer: Self-pay | Admitting: Obstetrics & Gynecology

## 2017-11-02 ENCOUNTER — Ambulatory Visit (INDEPENDENT_AMBULATORY_CARE_PROVIDER_SITE_OTHER): Payer: 59 | Admitting: Obstetrics & Gynecology

## 2017-11-02 VITALS — BP 156/98

## 2017-11-02 DIAGNOSIS — N6459 Other signs and symptoms in breast: Secondary | ICD-10-CM

## 2017-11-02 DIAGNOSIS — N644 Mastodynia: Secondary | ICD-10-CM | POA: Diagnosis not present

## 2017-11-02 NOTE — Patient Instructions (Addendum)
1.  Left Nipple pain Left nipple tingling, itching, thickness and pain after starting estradiol patch, symptoms mostly resolved after stopping the estradiol therapy.  Left breast exam including left nipple normal.  Patient is also due for screening mammogram.  Decision to proceed with a left diagnostic mammogram with left breast ultrasound and a screening right mammography.  Ariel Lopez, it was a pleasure seeing you today!  I will review your mammography and breast ultrasound results when available.

## 2017-11-02 NOTE — Telephone Encounter (Signed)
Orders placed at breast center, pt scheduled on 11/08/17 @ 2:00pm pt informed.

## 2017-11-02 NOTE — Progress Notes (Signed)
    Ariel Lopez 02-17-1961 [id number]        56 y.o.  G2P0A2   RP:  Left nipple tingling/itching after starting Estradiol patch  HPI:  C/O of Left nipple tingling and itching as soon as started Estradiol patch.  Decided to stop it and the symptoms resolved except that she still feels a thickness behind the left nipple. No nipple d/c.  No redness.  H/O Breast reduction.  Had similar Sxs for a while after the breast reduction surgery.  No fever.  Menopausal Sxs tolerable currently without HRT.  Past medical history,surgical history, problem list, medications, allergies, family history and social history were all reviewed and documented in the EPIC chart.  Directed ROS with pertinent positives and negatives documented in the history of present illness/assessment and plan.  Exam:  Vitals:   11/02/17 1036  BP: (!) 156/98   General appearance:  Normal  Breast exam:  S/P Bilateral breast reduction.  Rt breast normal.  Rt axilla normal.  Lt breast normal.  No nipple change.  No nodule or mass felt.  No erythema.  No nipple d/c.  NT.  Lt axilla normal.   Assessment/Plan:  56 y.o. G2P0   1.  Left Nipple pain Left nipple tingling, itching, thickness and pain after starting estradiol patch, symptoms mostly resolved after stopping the estradiol therapy.  Left breast exam including left nipple normal.  Patient is also due for screening mammogram.  Decision to proceed with a left diagnostic mammogram with left breast ultrasound and a screening right mammography.  Counseling on above issues >50% x 15 minutes.  Princess Bruins MD, 11:07 AM 11/02/2017

## 2017-11-02 NOTE — Telephone Encounter (Signed)
-----   Message from Princess Bruins, MD sent at 11/02/2017 11:19 AM EST ----- Regarding: Refer for Left Dx Mammo/Breast US and Right Screening Mammo Left nipple tingling/itching/thickness after starting Estradiol patch.  Due for Screening Mammo as well.

## 2017-11-07 ENCOUNTER — Ambulatory Visit: Payer: 59

## 2017-11-07 ENCOUNTER — Ambulatory Visit
Admission: RE | Admit: 2017-11-07 | Discharge: 2017-11-07 | Disposition: A | Discharge status: Home / Self Care | Payer: 59 | Source: Ambulatory Visit | Attending: Obstetrics & Gynecology | Admitting: Obstetrics & Gynecology

## 2017-11-07 DIAGNOSIS — R928 Other abnormal and inconclusive findings on diagnostic imaging of breast: Secondary | ICD-10-CM | POA: Diagnosis not present

## 2017-11-07 DIAGNOSIS — N6459 Other signs and symptoms in breast: Secondary | ICD-10-CM

## 2017-11-08 ENCOUNTER — Other Ambulatory Visit: Payer: 59

## 2017-11-13 ENCOUNTER — Encounter: Payer: 59 | Admitting: Obstetrics & Gynecology

## 2018-01-31 ENCOUNTER — Ambulatory Visit (INDEPENDENT_AMBULATORY_CARE_PROVIDER_SITE_OTHER): Payer: 59 | Admitting: Family Medicine

## 2018-01-31 ENCOUNTER — Encounter: Payer: Self-pay | Admitting: Family Medicine

## 2018-01-31 VITALS — BP 140/90 | HR 78 | Ht 61.5 in | Wt 165.8 lb

## 2018-01-31 DIAGNOSIS — Z0000 Encounter for general adult medical examination without abnormal findings: Secondary | ICD-10-CM | POA: Diagnosis not present

## 2018-01-31 DIAGNOSIS — I10 Essential (primary) hypertension: Secondary | ICD-10-CM | POA: Diagnosis not present

## 2018-01-31 MED ORDER — DILTIAZEM HCL ER 180 MG PO CP24: 180.0000 mg | ORAL_CAPSULE | Freq: Every day | ORAL | 1 refills | Status: DC

## 2018-01-31 NOTE — Progress Notes (Signed)
Subjective:  Patient ID: Ariel Lopez, female    DOB: 08-30-1961  Age: 57 y.o. MRN: [id number]  CC: Establish Care   HPI Ariel Lopez presents for Longstanding ho borderline elevated bp. Higher more recently. Associated with intermittent palpitations. Sp 2 er visits for this issue. Currently menopausal and stressed. Had to move to this area because husband had lost his job. She denies headaches or blurred vision but has taken decongestants. Denies excssive caffeine, etoh or illicit drug use. Ho normal to favorable lipid profile. 2 grown sons who live away. One is studying to become an NP. Metoprolol for migraine prophylaxis had led to weight gain. She had gained 20lbs.   History Ariel Lopez has a past medical history of Kidney stone and Migraine.   She has a past surgical history that includes Breast reduction surgery (2012); [place] ablation (2013); and Reduction mammaplasty (Bilateral, 2012).   Her family history includes Breast cancer in her maternal grandmother and paternal grandmother; Cervical cancer in her sister; Colon cancer in her maternal aunt; Hypercholesterolemia in her father and mother; Hypertension in her father and mother.She reports that she has quit smoking. she has never used smokeless tobacco. She reports that she drinks alcohol. She reports that she does not use drugs.  Outpatient Medications Prior to Visit  Medication Sig Dispense Refill  . acetaminophen (TYLENOL) 500 MG tablet Take 1,000 mg every 6 (six) hours as needed by mouth for moderate pain.    . Cholecalciferol (VITAMIN D PO) Take 1 tablet by mouth daily.     Marland Kitchen ibuprofen (ADVIL,MOTRIN) 200 MG tablet Take 400 mg every 6 (six) hours as needed by mouth for moderate pain.    Marland Kitchen MAGNESIUM PO Take 1 tablet by mouth daily.    . Probiotic Product (PROBIOTIC PO) Take 1 tablet by mouth daily.    . progesterone (PROMETRIUM) 100 MG capsule Take 1 capsule (100 mg total) by mouth daily. 90 capsule 4  . estradiol (CLIMARA - DOSED  IN MG/24 HR) 0.05 mg/24hr patch Place 1 patch (0.05 mg total) onto the skin once a week. 4 patch 12   No facility-administered medications prior to visit.     ROS Review of Systems  Objective:  BP (!) 170/100 (BP Location: Left Arm, Patient Position: Sitting, Cuff Size: Normal)   Pulse 78   Ht 5' 1.5" (1.562 m)   Wt 165 lb 12.8 oz (75.2 kg)   BMI 30.82 kg/m   Physical Exam    Assessment & Plan:   Ariel Lopez was seen today for establish care.  Diagnoses and all orders for this visit:  Essential hypertension -     CBC; Future -     Comprehensive metabolic panel; Future -     TSH; Future -     Urinalysis, Routine w reflex microscopic; Future -     diltiazem (DILACOR XR) 180 MG 24 hr capsule; Take 1 capsule (180 mg total) by mouth daily.  Healthcare maintenance -     CBC; Future -     Comprehensive metabolic panel; Future -     Lipid panel; Future -     Urinalysis, Routine w reflex microscopic; Future   I have discontinued Ariel Lopez's estradiol. I am also having her start on diltiazem. Additionally, I am having her maintain her Cholecalciferol (VITAMIN D PO), Probiotic Product (PROBIOTIC PO), MAGNESIUM PO, progesterone, ibuprofen, and acetaminophen.  Meds ordered this encounter  Medications  . diltiazem (DILACOR XR) 180 MG 24 hr capsule    Sig: Take  1 capsule (180 mg total) by mouth daily.    Dispense:  30 capsule    Refill:  1     Follow-up: Return in about 1 month (around 02/28/2018).  Libby Maw, MD

## 2018-01-31 NOTE — Patient Instructions (Addendum)
DASH Eating Plan DASH stands for "Dietary Approaches to Stop Hypertension." The DASH eating plan is a healthy eating plan that has been shown to reduce high blood pressure (hypertension). It may also reduce your risk for type 2 diabetes, heart disease, and stroke. The DASH eating plan may also help with weight loss. What are tips for following this plan? General guidelines  Avoid eating more than 2,300 mg (milligrams) of salt (sodium) a day. If you have hypertension, you may need to reduce your sodium intake to 1,500 mg a day.  Limit alcohol intake to no more than 1 drink a day for nonpregnant women and 2 drinks a day for men. One drink equals 12 oz of beer, 5 oz of wine, or 1 oz of hard liquor.  Work with your health care provider to maintain a healthy body weight or to lose weight. Ask what an ideal weight is for you.  Get at least 30 minutes of exercise that causes your heart to beat faster (aerobic exercise) most days of the week. Activities may include walking, swimming, or biking.  Work with your health care provider or diet and nutrition specialist (dietitian) to adjust your eating plan to your individual calorie needs. Reading food labels  Check food labels for the amount of sodium per serving. Choose foods with less than 5 percent of the Daily Value of sodium. Generally, foods with less than 300 mg of sodium per serving fit into this eating plan.  To find whole grains, look for the word "whole" as the first word in the ingredient list. Shopping  Buy products labeled as "low-sodium" or "no salt added."  Buy fresh foods. Avoid canned foods and premade or frozen meals. Cooking  Avoid adding salt when cooking. Use salt-free seasonings or herbs instead of table salt or sea salt. Check with your health care provider or pharmacist before using salt substitutes.  Do not fry foods. Cook foods using healthy methods such as baking, boiling, grilling, and broiling instead.  Cook with  heart-healthy oils, such as olive, canola, soybean, or sunflower oil. Meal planning   Eat a balanced diet that includes: ? 5 or more servings of fruits and vegetables each day. At each meal, try to fill half of your plate with fruits and vegetables. ? Up to 6-8 servings of whole grains each day. ? Less than 6 oz of lean meat, poultry, or fish each day. A 3-oz serving of meat is about the same size as a deck of cards. One egg equals 1 oz. ? 2 servings of low-fat dairy each day. ? A serving of nuts, seeds, or beans 5 times each week. ? Heart-healthy fats. Healthy fats called Omega-3 fatty acids are found in foods such as flaxseeds and coldwater fish, like sardines, salmon, and mackerel.  Limit how much you eat of the following: ? Canned or prepackaged foods. ? Food that is high in trans fat, such as fried foods. ? Food that is high in saturated fat, such as fatty meat. ? Sweets, desserts, sugary drinks, and other foods with added sugar. ? Full-fat dairy products.  Do not salt foods before eating.  Try to eat at least 2 vegetarian meals each week.  Eat more home-cooked food and less restaurant, buffet, and fast food.  When eating at a restaurant, ask that your food be prepared with less salt or no salt, if possible. What foods are recommended? The items listed may not be a complete list. Talk with your dietitian about what   dietary choices are best for you. Grains Whole-grain or whole-wheat bread. Whole-grain or whole-wheat pasta. Brown rice. Oatmeal. Quinoa. Bulgur. Whole-grain and low-sodium cereals. Pita bread. Low-fat, low-sodium crackers. Whole-wheat flour tortillas. Vegetables Fresh or frozen vegetables (raw, steamed, roasted, or grilled). Low-sodium or reduced-sodium tomato and vegetable juice. Low-sodium or reduced-sodium tomato sauce and tomato paste. Low-sodium or reduced-sodium canned vegetables. Fruits All fresh, dried, or frozen fruit. Canned fruit in natural juice (without  added sugar). Meat and other protein foods Skinless chicken or turkey. Ground chicken or turkey. Pork with fat trimmed off. Fish and seafood. Egg whites. Dried beans, peas, or lentils. Unsalted nuts, nut butters, and seeds. Unsalted canned beans. Lean cuts of beef with fat trimmed off. Low-sodium, lean deli meat. Dairy Low-fat (1%) or fat-free (skim) milk. Fat-free, low-fat, or reduced-fat cheeses. Nonfat, low-sodium ricotta or cottage cheese. Low-fat or nonfat yogurt. Low-fat, low-sodium cheese. Fats and oils Soft margarine without trans fats. Vegetable oil. Low-fat, reduced-fat, or light mayonnaise and salad dressings (reduced-sodium). Canola, safflower, olive, soybean, and sunflower oils. Avocado. Seasoning and other foods Herbs. Spices. Seasoning mixes without salt. Unsalted popcorn and pretzels. Fat-free sweets. What foods are not recommended? The items listed may not be a complete list. Talk with your dietitian about what dietary choices are best for you. Grains Baked goods made with fat, such as croissants, muffins, or some breads. Dry pasta or rice meal packs. Vegetables Creamed or fried vegetables. Vegetables in a cheese sauce. Regular canned vegetables (not low-sodium or reduced-sodium). Regular canned tomato sauce and paste (not low-sodium or reduced-sodium). Regular tomato and vegetable juice (not low-sodium or reduced-sodium). Pickles. Olives. Fruits Canned fruit in a light or heavy syrup. Fried fruit. Fruit in cream or butter sauce. Meat and other protein foods Fatty cuts of meat. Ribs. Fried meat. Bacon. Sausage. Bologna and other processed lunch meats. Salami. Fatback. Hotdogs. Bratwurst. Salted nuts and seeds. Canned beans with added salt. Canned or smoked fish. Whole eggs or egg yolks. Chicken or turkey with skin. Dairy Whole or 2% milk, cream, and half-and-half. Whole or full-fat cream cheese. Whole-fat or sweetened yogurt. Full-fat cheese. Nondairy creamers. Whipped toppings.  Processed cheese and cheese spreads. Fats and oils Butter. Stick margarine. Lard. Shortening. Ghee. Bacon fat. Tropical oils, such as coconut, palm kernel, or palm oil. Seasoning and other foods Salted popcorn and pretzels. Onion salt, garlic salt, seasoned salt, table salt, and sea salt. Worcestershire sauce. Tartar sauce. Barbecue sauce. Teriyaki sauce. Soy sauce, including reduced-sodium. Steak sauce. Canned and packaged gravies. Fish sauce. Oyster sauce. Cocktail sauce. Horseradish that you find on the shelf. Ketchup. Mustard. Meat flavorings and tenderizers. Bouillon cubes. Hot sauce and Tabasco sauce. Premade or packaged marinades. Premade or packaged taco seasonings. Relishes. Regular salad dressings. Where to find more information:  National Heart, Lung, and Blood Institute: www.nhlbi.nih.gov  American Heart Association: www.heart.org Summary  The DASH eating plan is a healthy eating plan that has been shown to reduce high blood pressure (hypertension). It may also reduce your risk for type 2 diabetes, heart disease, and stroke.  With the DASH eating plan, you should limit salt (sodium) intake to 2,300 mg a day. If you have hypertension, you may need to reduce your sodium intake to 1,500 mg a day.  When on the DASH eating plan, aim to eat more fresh fruits and vegetables, whole grains, lean proteins, low-fat dairy, and heart-healthy fats.  Work with your health care provider or diet and nutrition specialist (dietitian) to adjust your eating plan to your individual   calorie needs. This information is not intended to replace advice given to you by your health care provider. Make sure you discuss any questions you have with your health care provider. Document Released: 11/09/2011 Document Revised: 11/13/2016 Document Reviewed: 11/13/2016 Elsevier Interactive Patient Education  2018 [employer].  How to Increase Your Level of Physical Activity Getting regular physical activity is  important for your overall health and well-being. Most people do not get enough exercise. There are easy ways to increase your level of physical activity, even if you have not been very active in the past or you are just starting out. Why is physical activity important? Physical activity has many short-term and long-term health benefits. Regular exercise can:  Help you lose weight or maintain a healthy weight.  Strengthen your muscles and bones.  Boost your mood and improve self-esteem.  Reduce your risk of certain long-term (chronic) diseases, like heart disease, cancer, and diabetes.  Help you stay capable of walking and moving around (mobile) as you age.  Prevent accidents, such as falls, as you age.  Increase life expectancy.  What are the benefits of being physically active on a regular basis? In addition to improving your physical health, being physically active on most days of the week can help you in ways that you may not expect. Benefits of regular physical activity may include:  Feeling good about your body.  Being able to move around more easily and for longer periods of time without getting tired (increased stamina).  Finding new sources of fun and enjoyment.  Meeting new people who share a common interest.  Being able to fight off illness better (enhanced immunity).  Being able to sleep better.  What can happen if I am not physically active on a regular basis? Not getting enough physical activity can lead to an unhealthy lifestyle and future health problems. This can increase your chances of:  Becoming overweight or obese.  Becoming sick.  Developing chronic illnesses, like heart disease or diabetes.  Having mental health problems, like depression or anxiety.  Having sleep problems.  Having trouble walking or getting yourself around (reduced mobility).  Injuring yourself in a fall as you get older.  What steps can I take to be more physically  active?  Check with your health care provider about how to get started. Ask your health care provider what activities are safe for you.  Start out slowly. Walking or doing some simple chair exercises is a good place to start, especially if you have not been active before or for a long time.  Try to find activities that you enjoy. You are more likely to commit to an exercise routine if it does not feel like a chore.  If you have bone or joint problems, choose low-impact exercises, like walking or swimming.  Include physical activity in your everyday routine.  Invite friends or family members to exercise with you. This also will help you commit to your workout plan.  Set goals that you can work toward.  Aim for at least 150 minutes of moderate-intensity exercise each week. Examples of moderate-intensity exercise include walking or riding a bike. Where to find more information:  Centers for Disease Control and Prevention: BowlingGrip.is  [employer] on [employer] www.http://villegas.org/  ChooseMyPlate: [employer] Contact a health care provider if:  You have headaches, muscle aches, or joint pain.  You feel dizzy or light-headed while exercising.  You faint.  You have chest pain while exercising. Summary  Exercise benefits your mind and body at any age, even if you are just starting out.  If you have a chronic illness or have not been active for a while, check with your health care provider before increasing your physical activity.  Choose activities that are safe and enjoyable for you.Ask your health care provider what activities are safe for you.  Start slowly. Tell your health care provider if you have problems as you start to increase your activity level. This information is not intended to replace advice given to you by your health care provider. Make sure you discuss any questions you  have with your health care provider. Document Released: 11/09/2016 Document Revised: 11/09/2016 Document Reviewed: 11/09/2016 Elsevier Interactive Patient Education  2018 [employer].  How to Take Your Blood Pressure You can take your blood pressure at home with a machine. You may need to check your blood pressure at home:  To check if you have high blood pressure (hypertension).  To check your blood pressure over time.  To make sure your blood pressure medicine is working.  Supplies needed: You will need a blood pressure machine, or monitor. You can buy one at a drugstore or online. When choosing one:  Choose one with an arm cuff.  Choose one that wraps around your upper arm. Only one finger should fit between your arm and the cuff.  Do not choose one that measures your blood pressure from your wrist or finger.  Your doctor can suggest a monitor. How to prepare Avoid these things for 30 minutes before checking your blood pressure:  Drinking caffeine.  Drinking alcohol.  Eating.  Smoking.  Exercising.  Five minutes before checking your blood pressure:  Pee.  Sit in a dining chair. Avoid sitting in a soft couch or armchair.  Be quiet. Do not talk.  How to take your blood pressure Follow the instructions that came with your machine. If you have a digital blood pressure monitor, these may be the instructions: 1. Sit up straight. 2. Place your feet on the floor. Do not cross your ankles or legs. 3. Rest your left arm at the level of your heart. You may rest it on a table, desk, or chair. 4. Pull up your shirt sleeve. 5. Wrap the blood pressure cuff around the upper part of your left arm. The cuff should be 1 inch (2.5 cm) above your elbow. It is best to wrap the cuff around bare skin. 6. Fit the cuff snugly around your arm. You should be able to place only one finger between the cuff and your arm. 7. Put the cord inside the groove of your elbow. 8. Press the power  button. 9. Sit quietly while the cuff fills with air and loses air. 10. Write down the numbers on the screen. 11. Wait 2-3 minutes and then repeat steps 1-10.  What do the numbers mean? Two numbers make up your blood pressure. The first number is called systolic pressure. The second is called diastolic pressure. An example of a blood pressure reading is "120 over 80" (or 120/80). If you are an adult and do not have a medical condition, use this guide to find out if your blood pressure is normal: Normal  First number: below 120.  Second number: below 80. Elevated  First number: 120-129.  Second number: below 80. Hypertension stage 1  First number: 130-139.  Second number: 80-89. Hypertension stage 2  First number: 140 or above.  Second number: 83 or above.  Your blood pressure is above normal even if only the top or bottom number is above normal. Follow these instructions at home:  Check your blood pressure as often as your doctor tells you to.  Take your monitor to your next doctor's appointment. Your doctor will: ? Make sure you are using it correctly. ? Make sure it is working right.  Make sure you understand what your blood pressure numbers should be.  Tell your doctor if your medicines are causing side effects. Contact a doctor if:  Your blood pressure keeps being high. Get help right away if:  Your first blood pressure number is higher than 180.  Your second blood pressure number is higher than 120. This information is not intended to replace advice given to you by your health care provider. Make sure you discuss any questions you have with your health care provider. Document Released: 11/02/2008 Document Revised: 10/18/2016 Document Reviewed: 04/28/2016 Elsevier Interactive Patient Education  2018 [employer].  Hypertension Hypertension, commonly called high blood pressure, is when the force of blood pumping through the arteries is too strong. The arteries  are the blood vessels that carry blood from the heart throughout the body. Hypertension forces the heart to work harder to pump blood and may cause arteries to become narrow or stiff. Having untreated or uncontrolled hypertension can cause heart attacks, strokes, kidney disease, and other problems. A blood pressure reading consists of a higher number over a lower number. Ideally, your blood pressure should be below 120/80. The first ("top") number is called the systolic pressure. It is a measure of the pressure in your arteries as your heart beats. The second ("bottom") number is called the diastolic pressure. It is a measure of the pressure in your arteries as the heart relaxes. What are the causes? The cause of this condition is not known. What increases the risk? Some risk factors for high blood pressure are under your control. Others are not. Factors you can change  Smoking.  Having type 2 diabetes mellitus, high cholesterol, or both.  Not getting enough exercise or physical activity.  Being overweight.  Having too much fat, sugar, calories, or salt (sodium) in your diet.  Drinking too much alcohol. Factors that are difficult or impossible to change  Having chronic kidney disease.  Having a family history of high blood pressure.  Age. Risk increases with age.  Race. You may be at higher risk if you are African-American.  Gender. Men are at higher risk than women before age 83. After age 11, women are at higher risk than men.  Having obstructive sleep apnea.  Stress. What are the signs or symptoms? Extremely high blood pressure (hypertensive crisis) may cause:  Headache.  Anxiety.  Shortness of breath.  Nosebleed.  Nausea and vomiting.  Severe chest pain.  Jerky movements you cannot control (seizures).  How is this diagnosed? This condition is diagnosed by measuring your blood pressure while you are seated, with your arm resting on a surface. The cuff of the  blood pressure monitor will be placed directly against the skin of your upper arm at the level of your heart. It should be measured at least twice using the same arm. Certain conditions can cause a difference in blood pressure between your right and left arms. Certain factors can cause blood pressure readings to be lower or higher than normal (elevated) for a short period of time:  When your blood pressure is higher when you are in a health care provider's  office than when you are at home, this is called white coat hypertension. Most people with this condition do not need medicines.  When your blood pressure is higher at home than when you are in a health care provider's office, this is called masked hypertension. Most people with this condition may need medicines to control blood pressure.  If you have a high blood pressure reading during one visit or you have normal blood pressure with other risk factors:  You may be asked to return on a different day to have your blood pressure checked again.  You may be asked to monitor your blood pressure at home for 1 week or longer.  If you are diagnosed with hypertension, you may have other blood or imaging tests to help your health care provider understand your overall risk for other conditions. How is this treated? This condition is treated by making healthy lifestyle changes, such as eating healthy foods, exercising more, and reducing your alcohol intake. Your health care provider may prescribe medicine if lifestyle changes are not enough to get your blood pressure under control, and if:  Your systolic blood pressure is above 130.  Your diastolic blood pressure is above 80.  Your personal target blood pressure may vary depending on your medical conditions, your age, and other factors. Follow these instructions at home: Eating and drinking  Eat a diet that is high in fiber and potassium, and low in sodium, added sugar, and fat. An example eating  plan is called the DASH (Dietary Approaches to Stop Hypertension) diet. To eat this way: ? Eat plenty of fresh fruits and vegetables. Try to fill half of your plate at each meal with fruits and vegetables. ? Eat whole grains, such as whole wheat pasta, brown rice, or whole grain bread. Fill about one quarter of your plate with whole grains. ? Eat or drink low-fat dairy products, such as skim milk or low-fat yogurt. ? Avoid fatty cuts of meat, processed or cured meats, and poultry with skin. Fill about one quarter of your plate with lean proteins, such as fish, chicken without skin, beans, eggs, and tofu. ? Avoid premade and processed foods. These tend to be higher in sodium, added sugar, and fat.  Reduce your daily sodium intake. Most people with hypertension should eat less than 1,500 mg of sodium a day.  Limit alcohol intake to no more than 1 drink a day for nonpregnant women and 2 drinks a day for men. One drink equals 12 oz of beer, 5 oz of wine, or 1 oz of hard liquor. Lifestyle  Work with your health care provider to maintain a healthy body weight or to lose weight. Ask what an ideal weight is for you.  Get at least 30 minutes of exercise that causes your heart to beat faster (aerobic exercise) most days of the week. Activities may include walking, swimming, or biking.  Include exercise to strengthen your muscles (resistance exercise), such as pilates or lifting weights, as part of your weekly exercise routine. Try to do these types of exercises for 30 minutes at least 3 days a week.  Do not use any products that contain nicotine or tobacco, such as cigarettes and e-cigarettes. If you need help quitting, ask your health care provider.  Monitor your blood pressure at home as told by your health care provider.  Keep all follow-up visits as told by your health care provider. This is important. Medicines  Take over-the-counter and prescription medicines only as told by  your health care  provider. Follow directions carefully. Blood pressure medicines must be taken as prescribed.  Do not skip doses of blood pressure medicine. Doing this puts you at risk for problems and can make the medicine less effective.  Ask your health care provider about side effects or reactions to medicines that you should watch for. Contact a health care provider if:  You think you are having a reaction to a medicine you are taking.  You have headaches that keep coming back (recurring).  You feel dizzy.  You have swelling in your ankles.  You have trouble with your vision. Get help right away if:  You develop a severe headache or confusion.  You have unusual weakness or numbness.  You feel faint.  You have severe pain in your chest or abdomen.  You vomit repeatedly.  You have trouble breathing. Summary  Hypertension is when the force of blood pumping through your arteries is too strong. If this condition is not controlled, it may put you at risk for serious complications.  Your personal target blood pressure may vary depending on your medical conditions, your age, and other factors. For most people, a normal blood pressure is less than 120/80.  Hypertension is treated with lifestyle changes, medicines, or a combination of both. Lifestyle changes include weight loss, eating a healthy, low-sodium diet, exercising more, and limiting alcohol. This information is not intended to replace advice given to you by your health care provider. Make sure you discuss any questions you have with your health care provider. Document Released: 11/20/2005 Document Revised: 10/18/2016 Document Reviewed: 10/18/2016 Elsevier Interactive Patient Education  2018 [employer].  Managing Your Hypertension Hypertension is commonly called high blood pressure. This is when the force of your blood pressing against the walls of your arteries is too strong. Arteries are blood vessels that carry blood from your heart  throughout your body. Hypertension forces the heart to work harder to pump blood, and may cause the arteries to become narrow or stiff. Having untreated or uncontrolled hypertension can cause heart attack, stroke, kidney disease, and other problems. What are blood pressure readings? A blood pressure reading consists of a higher number over a lower number. Ideally, your blood pressure should be below 120/80. The first ("top") number is called the systolic pressure. It is a measure of the pressure in your arteries as your heart beats. The second ("bottom") number is called the diastolic pressure. It is a measure of the pressure in your arteries as the heart relaxes. What does my blood pressure reading mean? Blood pressure is classified into four stages. Based on your blood pressure reading, your health care provider may use the following stages to determine what type of treatment you need, if any. Systolic pressure and diastolic pressure are measured in a unit called mm Hg. Normal  Systolic pressure: below 132.  Diastolic pressure: below 80. Elevated  Systolic pressure: 440-102.  Diastolic pressure: below 80. Hypertension stage 1  Systolic pressure: 725-366.  Diastolic pressure: 44-03. Hypertension stage 2  Systolic pressure: 474 or above.  Diastolic pressure: 90 or above. What health risks are associated with hypertension? Managing your hypertension is an important responsibility. Uncontrolled hypertension can lead to:  A heart attack.  A stroke.  A weakened blood vessel (aneurysm).  Heart failure.  Kidney damage.  Eye damage.  Metabolic syndrome.  Memory and concentration problems.  What changes can I make to manage my hypertension? Hypertension can be managed by making lifestyle changes and possibly  by taking medicines. Your health care provider will help you make a plan to bring your blood pressure within a normal range. Eating and drinking  Eat a diet that is high  in fiber and potassium, and low in salt (sodium), added sugar, and fat. An example eating plan is called the DASH (Dietary Approaches to Stop Hypertension) diet. To eat this way: ? Eat plenty of fresh fruits and vegetables. Try to fill half of your plate at each meal with fruits and vegetables. ? Eat whole grains, such as whole wheat pasta, brown rice, or whole grain bread. Fill about one quarter of your plate with whole grains. ? Eat low-fat diary products. ? Avoid fatty cuts of meat, processed or cured meats, and poultry with skin. Fill about one quarter of your plate with lean proteins such as fish, chicken without skin, beans, eggs, and tofu. ? Avoid premade and processed foods. These tend to be higher in sodium, added sugar, and fat.  Reduce your daily sodium intake. Most people with hypertension should eat less than 1,500 mg of sodium a day.  Limit alcohol intake to no more than 1 drink a day for nonpregnant women and 2 drinks a day for men. One drink equals 12 oz of beer, 5 oz of wine, or 1 oz of hard liquor. Lifestyle  Work with your health care provider to maintain a healthy body weight, or to lose weight. Ask what an ideal weight is for you.  Get at least 30 minutes of exercise that causes your heart to beat faster (aerobic exercise) most days of the week. Activities may include walking, swimming, or biking.  Include exercise to strengthen your muscles (resistance exercise), such as weight lifting, as part of your weekly exercise routine. Try to do these types of exercises for 30 minutes at least 3 days a week.  Do not use any products that contain nicotine or tobacco, such as cigarettes and e-cigarettes. If you need help quitting, ask your health care provider.  Control any long-term (chronic) conditions you have, such as high cholesterol or diabetes. Monitoring  Monitor your blood pressure at home as told by your health care provider. Your personal target blood pressure may vary  depending on your medical conditions, your age, and other factors.  Have your blood pressure checked regularly, as often as told by your health care provider. Working with your health care provider  Review all the medicines you take with your health care provider because there may be side effects or interactions.  Talk with your health care provider about your diet, exercise habits, and other lifestyle factors that may be contributing to hypertension.  Visit your health care provider regularly. Your health care provider can help you create and adjust your plan for managing hypertension. Will I need medicine to control my blood pressure? Your health care provider may prescribe medicine if lifestyle changes are not enough to get your blood pressure under control, and if:  Your systolic blood pressure is 130 or higher.  Your diastolic blood pressure is 80 or higher.  Take medicines only as told by your health care provider. Follow the directions carefully. Blood pressure medicines must be taken as prescribed. The medicine does not work as well when you skip doses. Skipping doses also puts you at risk for problems. Contact a health care provider if:  You think you are having a reaction to medicines you have taken.  You have repeated (recurrent) headaches.  You feel dizzy.  You have  swelling in your ankles.  You have trouble with your vision. Get help right away if:  You develop a severe headache or confusion.  You have unusual weakness or numbness, or you feel faint.  You have severe pain in your chest or abdomen.  You vomit repeatedly.  You have trouble breathing. Summary  Hypertension is when the force of blood pumping through your arteries is too strong. If this condition is not controlled, it may put you at risk for serious complications.  Your personal target blood pressure may vary depending on your medical conditions, your age, and other factors. For most people, a  normal blood pressure is less than 120/80.  Hypertension is managed by lifestyle changes, medicines, or both. Lifestyle changes include weight loss, eating a healthy, low-sodium diet, exercising more, and limiting alcohol. This information is not intended to replace advice given to you by your health care provider. Make sure you discuss any questions you have with your health care provider. Document Released: 08/14/2012 Document Revised: 10/18/2016 Document Reviewed: 10/18/2016 Elsevier Interactive Patient Education  2018 [employer]. Diltiazem extended-release capsules or tablets What is this medicine? DILTIAZEM (dil TYE a zem) is a calcium-channel blocker. It affects the amount of calcium found in your heart and muscle cells. This relaxes your blood vessels, which can reduce the amount of work the heart has to do. This medicine is used to treat high blood pressure and chest pain caused by angina. This medicine may be used for other purposes; ask your health care provider or pharmacist if you have questions. COMMON BRAND NAME(S): Cardizem CD, Cardizem LA, Cardizem SR, Cartia XT, Dilacor XR, Dilt-CD, Diltia XT, Diltzac, Matzim LA, Rema Fendt, Tiamate, Tiazac What should I tell my health care provider before I take this medicine? They need to know if you have any of these conditions: -heart problems, low blood pressure, irregular heartbeat -liver disease -previous heart attack -an unusual or allergic reaction to diltiazem, other medicines, foods, dyes, or preservatives -pregnant or trying to get pregnant -breast-feeding How should I use this medicine? Take this medicine by mouth with a glass of water. Follow the directions on the prescription label. Swallow whole, do not crush or chew. Ask your doctor or pharmacist if your should take this medicine with food. Take your doses at regular intervals. Do not take your medicine more often then directed. Do not stop taking except on the advice of your  doctor or health care professional. Ask your doctor or health care professional how to gradually reduce the dose. Talk to your pediatrician regarding the use of this medicine in children. Special care may be needed. Overdosage: If you think you have taken too much of this medicine contact a poison control center or emergency room at once. NOTE: This medicine is only for you. Do not share this medicine with others. What if I miss a dose? If you miss a dose, take it as soon as you can. If it is almost time for your next dose, take only that dose. Do not take double or extra doses. What may interact with this medicine? Do not take this medicine with any of the following medications: -cisapride -hawthorn -pimozide -ranolazine -red yeast rice This medicine may also interact with the following medications: -buspirone -carbamazepine -cimetidine -cyclosporine -digoxin -local anesthetics or general anesthetics -lovastatin -medicines for anxiety or difficulty sleeping like midazolam and triazolam -medicines for high blood pressure or heart problems -quinidine -rifampin, rifabutin, or rifapentine This list may not describe all possible interactions.  Give your health care provider a list of all the medicines, herbs, non-prescription drugs, or dietary supplements you use. Also tell them if you smoke, drink alcohol, or use illegal drugs. Some items may interact with your medicine. What should I watch for while using this medicine? Check your blood pressure and pulse rate regularly. Ask your doctor or health care professional what your blood pressure and pulse rate should be and when you should contact him or her. You may feel dizzy or lightheaded. Do not drive, use machinery, or do anything that needs mental alertness until you know how this medicine affects you. To reduce the risk of dizzy or fainting spells, do not sit or stand up quickly, especially if you are an older patient. Alcohol can make you  more dizzy or increase flushing and rapid heartbeats. Avoid alcoholic drinks. What side effects may I notice from receiving this medicine? Side effects that you should report to your doctor or health care professional as soon as possible: -allergic reactions like skin rash, itching or hives, swelling of the face, lips, or tongue -confusion, mental depression -feeling faint or lightheaded, falls -redness, blistering, peeling or loosening of the skin, including inside the mouth -slow, irregular heartbeat -swelling of the feet and ankles -unusual bleeding or bruising, pinpoint red spots on the skin Side effects that usually do not require medical attention (report to your doctor or health care professional if they continue or are bothersome): -constipation or diarrhea -difficulty sleeping -facial flushing -headache -nausea, vomiting -sexual dysfunction -weak or tired This list may not describe all possible side effects. Call your doctor for medical advice about side effects. You may report side effects to FDA at 1-800-FDA-1088. Where should I keep my medicine? Keep out of the reach of children. Store at room temperature between 15 and 30 degrees C (59 and 86 degrees F). Protect from humidity. Throw away any unused medicine after the expiration date. NOTE: This sheet is a summary. It may not cover all possible information. If you have questions about this medicine, talk to your doctor, pharmacist, or health care provider.  2018 Elsevier/Gold Standard (2008-03-12 14:35:47)

## 2018-02-01 ENCOUNTER — Other Ambulatory Visit (INDEPENDENT_AMBULATORY_CARE_PROVIDER_SITE_OTHER): Payer: 59

## 2018-02-01 DIAGNOSIS — Z0000 Encounter for general adult medical examination without abnormal findings: Secondary | ICD-10-CM

## 2018-02-01 DIAGNOSIS — I10 Essential (primary) hypertension: Secondary | ICD-10-CM

## 2018-02-01 LAB — CBC
HEMATOCRIT: 43.9 % (ref 36.0–46.0)
HEMOGLOBIN: 15.3 g/dL — AB (ref 12.0–15.0)
MCHC: 34.8 g/dL (ref 30.0–36.0)
MCV: 93.5 fl (ref 78.0–100.0)
Platelets: 249 10*3/uL (ref 150.0–400.0)
RBC: 4.7 Mil/uL (ref 3.87–5.11)
RDW: 12.8 % (ref 11.5–15.5)
WBC: 3.8 10*3/uL — ABNORMAL LOW (ref 4.0–10.5)

## 2018-02-01 LAB — COMPREHENSIVE METABOLIC PANEL
ALT: 17 U/L (ref 0–35)
AST: 16 U/L (ref 0–37)
Albumin: 4.1 g/dL (ref 3.5–5.2)
Alkaline Phosphatase: 63 U/L (ref 39–117)
BUN: 10 mg/dL (ref 6–23)
CHLORIDE: 105 meq/L (ref 96–112)
CO2: 28 mEq/L (ref 19–32)
Calcium: 9.9 mg/dL (ref 8.4–10.5)
Creatinine, Ser: 0.7 mg/dL (ref 0.40–1.20)
GFR: 92.15 mL/min (ref >60.00)
GLUCOSE: 102 mg/dL — AB (ref 70–99)
POTASSIUM: 4.9 meq/L (ref 3.5–5.1)
SODIUM: 141 meq/L (ref 135–145)
Total Bilirubin: 0.6 mg/dL (ref 0.2–1.2)
Total Protein: 7 g/dL (ref 6.0–8.3)

## 2018-02-01 LAB — URINALYSIS, ROUTINE W REFLEX MICROSCOPIC
Bilirubin Urine: NEGATIVE
Hgb urine dipstick: NEGATIVE
KETONES UR: NEGATIVE
Leukocytes, UA: NEGATIVE
Nitrite: NEGATIVE
PH: 6 (ref 5.0–8.0)
RBC / HPF: NONE SEEN (ref 0–?)
Total Protein, Urine: NEGATIVE
URINE GLUCOSE: NEGATIVE
UROBILINOGEN UA: 0.2 (ref 0.0–1.0)

## 2018-02-01 LAB — LIPID PANEL
Cholesterol: 159 mg/dL (ref 0–200)
HDL: 63.3 mg/dL (ref >39.00)
LDL CALC: 78 mg/dL (ref 0–99)
NONHDL: 95.76
Total CHOL/HDL Ratio: 3
Triglycerides: 89 mg/dL (ref 0.0–149.0)
VLDL: 17.8 mg/dL (ref 0.0–40.0)

## 2018-02-01 LAB — TSH: TSH: 1.79 u[IU]/mL (ref 0.35–4.50)

## 2018-02-13 ENCOUNTER — Encounter: Payer: Self-pay | Admitting: Family Medicine

## 2018-02-28 ENCOUNTER — Ambulatory Visit (INDEPENDENT_AMBULATORY_CARE_PROVIDER_SITE_OTHER): Payer: 59 | Admitting: Family Medicine

## 2018-02-28 ENCOUNTER — Encounter: Payer: Self-pay | Admitting: Family Medicine

## 2018-02-28 VITALS — BP 126/80 | HR 79 | Ht 61.5 in | Wt 168.2 lb

## 2018-02-28 DIAGNOSIS — I10 Essential (primary) hypertension: Secondary | ICD-10-CM

## 2018-02-28 DIAGNOSIS — Z6831 Body mass index (BMI) 31.0-31.9, adult: Secondary | ICD-10-CM | POA: Diagnosis not present

## 2018-02-28 DIAGNOSIS — E6609 Other obesity due to excess calories: Secondary | ICD-10-CM | POA: Diagnosis not present

## 2018-02-28 MED ORDER — LORCASERIN HCL 10 MG PO TABS: ORAL_TABLET | ORAL | 0 refills | Status: DC

## 2018-02-28 MED ORDER — DILTIAZEM HCL ER 180 MG PO CP24: 180.0000 mg | ORAL_CAPSULE | Freq: Every day | ORAL | 1 refills | Status: DC

## 2018-02-28 NOTE — Progress Notes (Signed)
Subjective:  Patient ID: Ariel Lopez, female    DOB: Jun 27, 1961  Age: 57 y.o. MRN: [id number]  CC: Follow-up   HPI Ariel Lopez presents for follow-up of her blood pressure.  She says that the pressure tends to run a little bit higher at home than what were seen today but overall she feels better with the medicine.  It seems to have helped with her hot flashes a great deal.  She had one such episode of palpitation after drinking a couple of glasses of wine.  She later realized that she had been dehydrated.  Combination of these 2 things above her power the chronotropic effects of diltiazem.  She has been stressed and has had food cravings and gained some weight she has for my help without issue.  She is using an Dance movement psychotherapist.  Outpatient Medications Prior to Visit  Medication Sig Dispense Refill  . acetaminophen (TYLENOL) 500 MG tablet Take 1,000 mg every 6 (six) hours as needed by mouth for moderate pain.    . Cholecalciferol (VITAMIN D PO) Take 1 tablet by mouth daily.     Marland Kitchen ibuprofen (ADVIL,MOTRIN) 200 MG tablet Take 400 mg every 6 (six) hours as needed by mouth for moderate pain.    Marland Kitchen MAGNESIUM PO Take 1 tablet by mouth daily.    . Probiotic Product (PROBIOTIC PO) Take 1 tablet by mouth daily.    . progesterone (PROMETRIUM) 100 MG capsule Take 1 capsule (100 mg total) by mouth daily. 90 capsule 4  . diltiazem (DILACOR XR) 180 MG 24 hr capsule Take 1 capsule (180 mg total) by mouth daily. 30 capsule 1   No facility-administered medications prior to visit.     ROS Review of Systems  Constitutional: Negative.  Negative for chills, fatigue and fever.  HENT: Negative.   Eyes: Negative for photophobia and visual disturbance.  Respiratory: Negative for chest tightness, wheezing and stridor.   Cardiovascular: Negative for chest pain and palpitations.  Endocrine: Negative for polyphagia and polyuria.  Musculoskeletal: Negative for arthralgias and myalgias.  Skin: Negative.     Allergic/Immunologic: Negative for immunocompromised state.  Neurological: Negative for weakness and headaches.  Hematological: Does not bruise/bleed easily.  Psychiatric/Behavioral: Negative.     Objective:  BP 126/80 (BP Location: Left Arm, Patient Position: Sitting, Cuff Size: Normal)   Pulse 79   Ht 5' 1.5" (1.562 m)   Wt 168 lb 4 oz (76.3 kg)   SpO2 99%   BMI 31.28 kg/m   BP Readings from Last 3 Encounters:  02/28/18 126/80  01/31/18 140/90  11/02/17 (!) 156/98    Wt Readings from Last 3 Encounters:  02/28/18 168 lb 4 oz (76.3 kg)  01/31/18 165 lb 12.8 oz (75.2 kg)  10/20/17 150 lb (68 kg)    Physical Exam  Constitutional: She is oriented to person, place, and time. She appears well-developed and well-nourished. No distress.  HENT:  Head: Normocephalic and atraumatic.  Right Ear: External ear normal.  Left Ear: External ear normal.  Eyes: Right eye exhibits no discharge. Left eye exhibits no discharge. No scleral icterus.  Neck: No JVD present. No tracheal deviation present.  Pulmonary/Chest: Effort normal. No stridor.  Neurological: She is alert and oriented to person, place, and time.  Skin: Skin is warm and dry. She is not diaphoretic.  Psychiatric: She has a normal mood and affect. Her behavior is normal.    Lab Results  Component Value Date   WBC 3.8 (L) 02/01/2018  HGB 15.3 (H) 02/01/2018   HCT 43.9 02/01/2018   PLT 249.0 02/01/2018   GLUCOSE 102 (H) 02/01/2018   CHOL 159 02/01/2018   TRIG 89.0 02/01/2018   HDL 63.30 02/01/2018   LDLCALC 78 02/01/2018   ALT 17 02/01/2018   AST 16 02/01/2018   NA 141 02/01/2018   K 4.9 02/01/2018   CL 105 02/01/2018   CREATININE 0.70 02/01/2018   BUN 10 02/01/2018   CO2 28 02/01/2018   TSH 1.79 02/01/2018    Mm Diag Breast Tomo Bilateral  Result Date: 11/07/2017 CLINICAL DATA:  Patient presents complaining of left nipple itching with some aching that comes and goes, with symptoms over the last several  weeks.No reported lumps. No other complaints. Patient has a history of bilateral breast reduction surgery in 2012. EXAM: 2D DIGITAL DIAGNOSTIC BILATERAL MAMMOGRAM WITH CAD AND ADJUNCT TOMO COMPARISON:  Previous exam(s). ACR Breast Density Category b: There are scattered areas of fibroglandular density. FINDINGS: There are no discrete masses, areas of new architectural distortion, areas of significant asymmetry or suspicious calcifications. No mammographic change. Mammographic images were processed with CAD. IMPRESSION: No evidence of breast malignancy. RECOMMENDATION: Screening mammogram in one year.(Code:[id number]-B-01Y) I have discussed the findings and recommendations with the patient. Results were also provided in writing at the conclusion of the visit. If applicable, a reminder letter will be sent to the patient regarding the next appointment. BI-RADS CATEGORY  1: Negative. Electronically Signed   By: Lajean Manes M.D.   On: 11/07/2017 14:37    Assessment & Plan:   Ariel Lopez was seen today for follow-up.  Diagnoses and all orders for this visit:  Essential hypertension -     diltiazem (DILACOR XR) 180 MG 24 hr capsule; Take 1 capsule (180 mg total) by mouth daily.  Class 1 obesity due to excess calories without serious comorbidity with body mass index (BMI) of 31.0 to 31.9 in adult -     Lorcaserin HCl 10 MG TABS; Take one tablet twice daily -     Amb ref to Medical Nutrition Therapy-MNT   I am having [employer]  start on Lorcaserin HCl. I am also having her maintain her Cholecalciferol (VITAMIN D PO), Probiotic Product (PROBIOTIC PO), MAGNESIUM PO, progesterone, ibuprofen, acetaminophen, and diltiazem.  Meds ordered this encounter  Medications  . diltiazem (DILACOR XR) 180 MG 24 hr capsule    Sig: Take 1 capsule (180 mg total) by mouth daily.    Dispense:  90 capsule    Refill:  1  . Lorcaserin HCl 10 MG TABS    Sig: Take one tablet twice daily    Dispense:  60 tablet    Refill:  0      Follow-up: Return in about 2 months (around 04/30/2018).  Libby Maw, MD

## 2018-02-28 NOTE — Patient Instructions (Signed)
DASH Eating Plan DASH stands for "Dietary Approaches to Stop Hypertension." The DASH eating plan is a healthy eating plan that has been shown to reduce high blood pressure (hypertension). It may also reduce your risk for type 2 diabetes, heart disease, and stroke. The DASH eating plan may also help with weight loss. What are tips for following this plan? General guidelines  Avoid eating more than 2,300 mg (milligrams) of salt (sodium) a day. If you have hypertension, you may need to reduce your sodium intake to 1,500 mg a day.  Limit alcohol intake to no more than 1 drink a day for nonpregnant women and 2 drinks a day for men. One drink equals 12 oz of beer, 5 oz of wine, or 1 oz of hard liquor.  Work with your health care provider to maintain a healthy body weight or to lose weight. Ask what an ideal weight is for you.  Get at least 30 minutes of exercise that causes your heart to beat faster (aerobic exercise) most days of the week. Activities may include walking, swimming, or biking.  Work with your health care provider or diet and nutrition specialist (dietitian) to adjust your eating plan to your individual calorie needs. Reading food labels  Check food labels for the amount of sodium per serving. Choose foods with less than 5 percent of the Daily Value of sodium. Generally, foods with less than 300 mg of sodium per serving fit into this eating plan.  To find whole grains, look for the word "whole" as the first word in the ingredient list. Shopping  Buy products labeled as "low-sodium" or "no salt added."  Buy fresh foods. Avoid canned foods and premade or frozen meals. Cooking  Avoid adding salt when cooking. Use salt-free seasonings or herbs instead of table salt or sea salt. Check with your health care provider or pharmacist before using salt substitutes.  Do not fry foods. Cook foods using healthy methods such as baking, boiling, grilling, and broiling instead.  Cook with  heart-healthy oils, such as olive, canola, soybean, or sunflower oil. Meal planning   Eat a balanced diet that includes: ? 5 or more servings of fruits and vegetables each day. At each meal, try to fill half of your plate with fruits and vegetables. ? Up to 6-8 servings of whole grains each day. ? Less than 6 oz of lean meat, poultry, or fish each day. A 3-oz serving of meat is about the same size as a deck of cards. One egg equals 1 oz. ? 2 servings of low-fat dairy each day. ? A serving of nuts, seeds, or beans 5 times each week. ? Heart-healthy fats. Healthy fats called Omega-3 fatty acids are found in foods such as flaxseeds and coldwater fish, like sardines, salmon, and mackerel.  Limit how much you eat of the following: ? Canned or prepackaged foods. ? Food that is high in trans fat, such as fried foods. ? Food that is high in saturated fat, such as fatty meat. ? Sweets, desserts, sugary drinks, and other foods with added sugar. ? Full-fat dairy products.  Do not salt foods before eating.  Try to eat at least 2 vegetarian meals each week.  Eat more home-cooked food and less restaurant, buffet, and fast food.  When eating at a restaurant, ask that your food be prepared with less salt or no salt, if possible. What foods are recommended? The items listed may not be a complete list. Talk with your dietitian about what   dietary choices are best for you. Grains Whole-grain or whole-wheat bread. Whole-grain or whole-wheat pasta. Brown rice. Oatmeal. Quinoa. Bulgur. Whole-grain and low-sodium cereals. Pita bread. Low-fat, low-sodium crackers. Whole-wheat flour tortillas. Vegetables Fresh or frozen vegetables (raw, steamed, roasted, or grilled). Low-sodium or reduced-sodium tomato and vegetable juice. Low-sodium or reduced-sodium tomato sauce and tomato paste. Low-sodium or reduced-sodium canned vegetables. Fruits All fresh, dried, or frozen fruit. Canned fruit in natural juice (without  added sugar). Meat and other protein foods Skinless chicken or turkey. Ground chicken or turkey. Pork with fat trimmed off. Fish and seafood. Egg whites. Dried beans, peas, or lentils. Unsalted nuts, nut butters, and seeds. Unsalted canned beans. Lean cuts of beef with fat trimmed off. Low-sodium, lean deli meat. Dairy Low-fat (1%) or fat-free (skim) milk. Fat-free, low-fat, or reduced-fat cheeses. Nonfat, low-sodium ricotta or cottage cheese. Low-fat or nonfat yogurt. Low-fat, low-sodium cheese. Fats and oils Soft margarine without trans fats. Vegetable oil. Low-fat, reduced-fat, or light mayonnaise and salad dressings (reduced-sodium). Canola, safflower, olive, soybean, and sunflower oils. Avocado. Seasoning and other foods Herbs. Spices. Seasoning mixes without salt. Unsalted popcorn and pretzels. Fat-free sweets. What foods are not recommended? The items listed may not be a complete list. Talk with your dietitian about what dietary choices are best for you. Grains Baked goods made with fat, such as croissants, muffins, or some breads. Dry pasta or rice meal packs. Vegetables Creamed or fried vegetables. Vegetables in a cheese sauce. Regular canned vegetables (not low-sodium or reduced-sodium). Regular canned tomato sauce and paste (not low-sodium or reduced-sodium). Regular tomato and vegetable juice (not low-sodium or reduced-sodium). Pickles. Olives. Fruits Canned fruit in a light or heavy syrup. Fried fruit. Fruit in cream or butter sauce. Meat and other protein foods Fatty cuts of meat. Ribs. Fried meat. Bacon. Sausage. Bologna and other processed lunch meats. Salami. Fatback. Hotdogs. Bratwurst. Salted nuts and seeds. Canned beans with added salt. Canned or smoked fish. Whole eggs or egg yolks. Chicken or turkey with skin. Dairy Whole or 2% milk, cream, and half-and-half. Whole or full-fat cream cheese. Whole-fat or sweetened yogurt. Full-fat cheese. Nondairy creamers. Whipped toppings.  Processed cheese and cheese spreads. Fats and oils Butter. Stick margarine. Lard. Shortening. Ghee. Bacon fat. Tropical oils, such as coconut, palm kernel, or palm oil. Seasoning and other foods Salted popcorn and pretzels. Onion salt, garlic salt, seasoned salt, table salt, and sea salt. Worcestershire sauce. Tartar sauce. Barbecue sauce. Teriyaki sauce. Soy sauce, including reduced-sodium. Steak sauce. Canned and packaged gravies. Fish sauce. Oyster sauce. Cocktail sauce. Horseradish that you find on the shelf. Ketchup. Mustard. Meat flavorings and tenderizers. Bouillon cubes. Hot sauce and Tabasco sauce. Premade or packaged marinades. Premade or packaged taco seasonings. Relishes. Regular salad dressings. Where to find more information:  National Heart, Lung, and Blood Institute: www.nhlbi.nih.gov  American Heart Association: www.heart.org Summary  The DASH eating plan is a healthy eating plan that has been shown to reduce high blood pressure (hypertension). It may also reduce your risk for type 2 diabetes, heart disease, and stroke.  With the DASH eating plan, you should limit salt (sodium) intake to 2,300 mg a day. If you have hypertension, you may need to reduce your sodium intake to 1,500 mg a day.  When on the DASH eating plan, aim to eat more fresh fruits and vegetables, whole grains, lean proteins, low-fat dairy, and heart-healthy fats.  Work with your health care provider or diet and nutrition specialist (dietitian) to adjust your eating plan to your individual   calorie needs. This information is not intended to replace advice given to you by your health care provider. Make sure you discuss any questions you have with your health care provider. Document Released: 11/09/2011 Document Revised: 11/13/2016 Document Reviewed: 11/13/2016 Elsevier Interactive Patient Education  2018 [place] oral tablets What is this medicine? LORCASERIN (lor ca SER in) is used to promote  and maintain weight loss in obese patients. This medicine should be used with a reduced calorie diet and, if appropriate, an exercise program. This medicine may be used for other purposes; ask your health care provider or pharmacist if you have questions. COMMON BRAND NAME(S): Belviq What should I tell my health care provider before I take this medicine? They need to know if you have any of these conditions: -anatomical deformation of the penis, Peyronie's disease, or history of priapism (painful and prolonged erection) -diabetes -heart disease -history of blood diseases, like sickle cell anemia or leukemia -history of irregular heartbeat -kidney disease -liver disease -suicidal thoughts, plans, or attempt; a previous suicide attempt by you or a family member -an unusual or allergic reaction to lorcaserin, other medicines, foods, dyes, or preservatives -pregnant or trying to get pregnant -breast-feeding How should I use this medicine? Take this medicine by mouth with a glass of water. Follow the directions on the prescription label. You can take it with or without food. Take your medicine at regular intervals. Do not take it more often than directed. Do not stop taking except on your doctor's advice. Talk to your pediatrician regarding the use of this medicine in children. Special care may be needed. Overdosage: If you think you have taken too much of this medicine contact a poison control center or emergency room at once. NOTE: This medicine is only for you. Do not share this medicine with others. What if I miss a dose? If you miss a dose, take it as soon as you can. If it is almost time for your next dose, take only that dose. Do not take double or extra doses. What may interact with this medicine? -cabergoline -certain medicines for depression, anxiety, or psychotic disturbances -certain medicines for erectile dysfunction -certain medicines for migraine headache like almotriptan,  eletriptan, frovatriptan, naratriptan, rizatriptan, sumatriptan, zolmitriptan -dextromethorphan -linezolid -lithium -medicines for diabetes -other weight loss products -tramadol -St. John's Wort -stimulant medicines for attention disorders, weight loss, or to stay awake -tryptophan This list may not describe all possible interactions. Give your health care provider a list of all the medicines, herbs, non-prescription drugs, or dietary supplements you use. Also tell them if you smoke, drink alcohol, or use illegal drugs. Some items may interact with your medicine. What should I watch for while using this medicine? This medicine is intended to be used in addition to a healthy diet and appropriate exercise. The best results are achieved this way. Your doctor should instruct you to stop taking this medicine if you do not lose a certain amount of weight within the first 12 weeks of treatment, but it is important that you do not change your dose in any way without consulting your doctor or health care professional. Visit your doctor or health care professional for regular checkups. Your doctor may order blood tests or other tests to see how you are doing. Do not drive, use machinery, or do anything that needs mental alertness until you know how this medicine affects you. This medicine may affect blood sugar levels. If you have diabetes, check with your doctor or health  care professional before you change your diet or the dose of your diabetic medicine. Patients and their families should watch out for worsening depression or thoughts of suicide. Also watch out for sudden changes in feelings such as feeling anxious, agitated, panicky, irritable, hostile, aggressive, impulsive, severely restless, overly excited and hyperactive, or not being able to sleep. If this happens, especially at the beginning of treatment or after a change in dose, call your health care professional. Contact your doctor or health care  professional right away if you are a man with an erection that lasts longer than 4 hours or if the erection becomes painful. This may be a sign of serious problem and must be treated right away to prevent permanent damage. What side effects may I notice from receiving this medicine? Side effects that you should report to your doctor or health care professional as soon as possible: -allergic reactions like skin rash, itching or hives, swelling of the face, lips, or tongue -abnormal production of milk -breast enlargement in both males and females -breathing problems -changes in emotions or moods -changes in vision -confusion -erection lasting more than 4 hours or a painful erection -fast or irregular heart beat -feeling faint or lightheaded, falls -fever or chills, sore throat -hallucination, loss of contact with reality -high or low blood pressure -menstrual changes -restlessness -slow or irregular heartbeat -stiff muscles -sweating -suicidal thoughts or other mood changes -swelling of the ankles, feet, hands -unusually weak or tired -vomiting Side effects that usually do not require medical attention (report to your doctor or health care professional if they continue or are bothersome): -back pain -constipation -cough -dry mouth -nausea -tiredness This list may not describe all possible side effects. Call your doctor for medical advice about side effects. You may report side effects to FDA at 1-800-FDA-1088. Where should I keep my medicine? Keep out of the reach of children. This medicine can be abused. Keep your medicine in a safe place to protect it from theft. Do not share this medicine with anyone. Selling or giving away this medicine is dangerous and against the law. Store at room temperature between 15 and 30 degrees C (59 and 86 degrees F). Throw away any unused medicine after the expiration date. NOTE: This sheet is a summary. It may not cover all possible information. If  you have questions about this medicine, talk to your doctor, pharmacist, or health care provider.  2018 Elsevier/Gold Standard (2015-12-23 12:13:31)

## 2018-04-30 ENCOUNTER — Ambulatory Visit: Payer: 59 | Admitting: Family Medicine

## 2018-05-16 ENCOUNTER — Ambulatory Visit: Payer: 59 | Admitting: Family Medicine

## 2018-06-24 ENCOUNTER — Ambulatory Visit (INDEPENDENT_AMBULATORY_CARE_PROVIDER_SITE_OTHER): Payer: 59 | Admitting: Family Medicine

## 2018-06-24 ENCOUNTER — Encounter: Payer: Self-pay | Admitting: Family Medicine

## 2018-06-24 VITALS — BP 132/80 | HR 70 | Temp 97.6°F | Ht 61.5 in | Wt 162.1 lb

## 2018-06-24 DIAGNOSIS — I10 Essential (primary) hypertension: Secondary | ICD-10-CM | POA: Diagnosis not present

## 2018-06-24 DIAGNOSIS — R002 Palpitations: Secondary | ICD-10-CM | POA: Diagnosis not present

## 2018-06-24 MED ORDER — DILTIAZEM HCL ER 180 MG PO CP24: 180.0000 mg | ORAL_CAPSULE | Freq: Every day | ORAL | 1 refills | Status: DC

## 2018-06-24 MED ORDER — CHLORTHALIDONE 25 MG PO TABS: 25.0000 mg | ORAL_TABLET | Freq: Every day | ORAL | 1 refills | Status: DC

## 2018-06-24 NOTE — Progress Notes (Addendum)
Subjective:  Patient ID: Ariel Lopez, female    DOB: 08-Aug-1961  Age: 57 y.o. MRN: [id number]  CC: Follow-up   HPI Ariel Lopez presents for follow-up of her blood pressure.  At home her pressure is running in the upper 130s over upper 80s.  She is having no issues with the diltiazem.  It is definitely been controlling her palpitations, headaches and has reduced her hot flashes.  She denies significant issue with lower extremity edema and edema but her feet do swell at times.  She was unable to fill the locaserin rx due to cost. She has been able to loose weight on her own.  She is exercising daily.  Her son and his wife are expecting their first child in a few months.  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  She is due for a Pap smear with her GYN provider.  Outpatient Medications Prior to Visit  Medication Sig Dispense Refill  . acetaminophen (TYLENOL) 500 MG tablet Take 1,000 mg every 6 (six) hours as needed by mouth for moderate pain.    . Cholecalciferol (VITAMIN D PO) Take 1 tablet by mouth daily.     Marland Kitchen ibuprofen (ADVIL,MOTRIN) 200 MG tablet Take 400 mg every 6 (six) hours as needed by mouth for moderate pain.    Marland Kitchen MAGNESIUM PO Take 1 tablet by mouth daily.    . Probiotic Product (PROBIOTIC PO) Take 1 tablet by mouth daily.    . progesterone (PROMETRIUM) 100 MG capsule Take 1 capsule (100 mg total) by mouth daily. 90 capsule 4  . diltiazem (DILACOR XR) 180 MG 24 hr capsule Take 1 capsule (180 mg total) by mouth daily. 90 capsule 1  . Lorcaserin HCl 10 MG TABS Take one tablet twice daily 60 tablet 0   No facility-administered medications prior to visit.     ROS Review of Systems  Constitutional: Negative for chills, fatigue, fever and unexpected weight change.  HENT: Negative.   Eyes: Negative.   Respiratory: Negative.  Negative for chest tightness and shortness of breath.   Cardiovascular: Negative.  Negative for chest pain and palpitations.  Gastrointestinal:  Negative.   Endocrine: Negative for polyphagia and polyuria.  Genitourinary: Negative.   Musculoskeletal: Negative for arthralgias and myalgias.  Skin: Negative for pallor and rash.  Allergic/Immunologic: Negative for immunocompromised state.  Neurological: Negative for numbness and headaches.  Hematological: Does not bruise/bleed easily.  Psychiatric/Behavioral: Negative.     Objective:  BP 132/80   Pulse 70   Temp 97.6 F (36.4 C)   Ht 5' 1.5" (1.562 m)   Wt 162 lb 2 oz (73.5 kg)   SpO2 96%   BMI 30.14 kg/m   BP Readings from Last 3 Encounters:  06/24/18 132/80  02/28/18 126/80  01/31/18 140/90    Wt Readings from Last 3 Encounters:  06/24/18 162 lb 2 oz (73.5 kg)  02/28/18 168 lb 4 oz (76.3 kg)  01/31/18 165 lb 12.8 oz (75.2 kg)    Physical Exam  Constitutional: She is oriented to person, place, and time. She appears well-developed and well-nourished. No distress.  HENT:  Head: Normocephalic and atraumatic.  Right Ear: External ear normal.  Left Ear: External ear normal.  Mouth/Throat: Oropharynx is clear and moist. No oropharyngeal exudate.  Eyes: Pupils are equal, round, and reactive to light. Conjunctivae and EOM are normal. Right eye exhibits no discharge. Left eye exhibits no discharge. No scleral icterus.  Neck: Neck supple. No JVD present. No tracheal  deviation present. No thyromegaly present.  Cardiovascular: Normal rate, regular rhythm and normal heart sounds.  Pulmonary/Chest: Effort normal and breath sounds normal.  Musculoskeletal: She exhibits edema (trace).  Lymphadenopathy:    She has no cervical adenopathy.  Neurological: She is alert and oriented to person, place, and time.  Skin: Skin is warm and dry. She is not diaphoretic.  Psychiatric: She has a normal mood and affect. Her behavior is normal.    Lab Results  Component Value Date   WBC 3.8 (L) 02/01/2018   HGB 15.3 (H) 02/01/2018   HCT 43.9 02/01/2018   PLT 249.0 02/01/2018   GLUCOSE  102 (H) 02/01/2018   CHOL 159 02/01/2018   TRIG 89.0 02/01/2018   HDL 63.30 02/01/2018   LDLCALC 78 02/01/2018   ALT 17 02/01/2018   AST 16 02/01/2018   NA 141 02/01/2018   K 4.9 02/01/2018   CL 105 02/01/2018   CREATININE 0.70 02/01/2018   BUN 10 02/01/2018   CO2 28 02/01/2018   TSH 1.79 02/01/2018    Mm Diag Breast Tomo Bilateral  Result Date: 11/07/2017 CLINICAL DATA:  Patient presents complaining of left nipple itching with some aching that comes and goes, with symptoms over the last several weeks.No reported lumps. No other complaints. Patient has a history of bilateral breast reduction surgery in 2012. EXAM: 2D DIGITAL DIAGNOSTIC BILATERAL MAMMOGRAM WITH CAD AND ADJUNCT TOMO COMPARISON:  Previous exam(s). ACR Breast Density Category b: There are scattered areas of fibroglandular density. FINDINGS: There are no discrete masses, areas of new architectural distortion, areas of significant asymmetry or suspicious calcifications. No mammographic change. Mammographic images were processed with CAD. IMPRESSION: No evidence of breast malignancy. RECOMMENDATION: Screening mammogram in one year.(Code:SM-B-01Y) I have discussed the findings and recommendations with the patient. Results were also provided in writing at the conclusion of the visit. If applicable, a reminder letter will be sent to the patient regarding the next appointment. BI-RADS CATEGORY  1: Negative. Electronically Signed   By: Lajean Manes M.D.   On: 11/07/2017 14:37    Assessment & Plan:   Ariel Lopez was seen today for follow-up.  Diagnoses and all orders for this visit:  Essential hypertension -     diltiazem (DILACOR XR) 180 MG 24 hr capsule; Take 1 capsule (180 mg total) by mouth daily. -     chlorthalidone (HYGROTON) 25 MG tablet; Take 1 tablet (25 mg total) by mouth daily.  Palpitations -     diltiazem (DILACOR XR) 180 MG 24 hr capsule; Take 1 capsule (180 mg total) by mouth daily.   I have discontinued Ariel  Lopez's Lorcaserin HCl. I am also having her start on chlorthalidone. Additionally, I am having her maintain her Cholecalciferol (VITAMIN D PO), Probiotic Product (PROBIOTIC PO), MAGNESIUM PO, progesterone, ibuprofen, acetaminophen, and diltiazem.  Meds ordered this encounter  Medications  . diltiazem (DILACOR XR) 180 MG 24 hr capsule    Sig: Take 1 capsule (180 mg total) by mouth daily.    Dispense:  90 capsule    Refill:  1  . chlorthalidone (HYGROTON) 25 MG tablet    Sig: Take 1 tablet (25 mg total) by mouth daily.    Dispense:  90 tablet    Refill:  1   We discussed doubling the dose of diltiazem versus adding chlorthalidone.  Agreed that chlorthalidone will do more to lower blood pressure down into the 120s over lower 80s range.  Patient will check and record her blood pressure.  She  will follow-up in 3 months for potassium check.  She will follow-up sooner if her pressures start averaging less than 100 over 60s.  She was given information on potassium and how to add potassium to her diet.  Patient was given information for good Rx.  Also suggested that she go to the company's website to look for just discount coupons.  Follow-up: Return in about 3 months (around 09/24/2018).  Libby Maw, MD

## 2018-06-24 NOTE — Patient Instructions (Signed)

## 2018-09-06 ENCOUNTER — Other Ambulatory Visit: Payer: Self-pay | Admitting: Obstetrics & Gynecology

## 2018-09-23 ENCOUNTER — Ambulatory Visit (INDEPENDENT_AMBULATORY_CARE_PROVIDER_SITE_OTHER): Payer: 59 | Admitting: Family Medicine

## 2018-09-23 ENCOUNTER — Encounter: Payer: Self-pay | Admitting: Family Medicine

## 2018-09-23 VITALS — BP 138/80 | HR 79 | Ht 61.5 in | Wt 160.0 lb

## 2018-09-23 DIAGNOSIS — S41111A Laceration without foreign body of right upper arm, initial encounter: Secondary | ICD-10-CM | POA: Diagnosis not present

## 2018-09-23 DIAGNOSIS — R002 Palpitations: Secondary | ICD-10-CM

## 2018-09-23 DIAGNOSIS — E6609 Other obesity due to excess calories: Secondary | ICD-10-CM

## 2018-09-23 DIAGNOSIS — I10 Essential (primary) hypertension: Secondary | ICD-10-CM | POA: Diagnosis not present

## 2018-09-23 DIAGNOSIS — Z6831 Body mass index (BMI) 31.0-31.9, adult: Secondary | ICD-10-CM

## 2018-09-23 DIAGNOSIS — Z23 Encounter for immunization: Secondary | ICD-10-CM | POA: Diagnosis not present

## 2018-09-23 MED ORDER — DILTIAZEM HCL ER 180 MG PO CP24: 180.0000 mg | ORAL_CAPSULE | Freq: Every day | ORAL | 1 refills | Status: DC

## 2018-09-23 NOTE — Patient Instructions (Signed)
Exercising to Lose Weight Exercising can help you to lose weight. In order to lose weight through exercise, you need to do vigorous-intensity exercise. You can tell that you are exercising with vigorous intensity if you are breathing very hard and fast and cannot hold a conversation while exercising. Moderate-intensity exercise helps to maintain your current weight. You can tell that you are exercising at a moderate level if you have a higher heart rate and faster breathing, but you are still able to hold a conversation. How often should I exercise? Choose an activity that you enjoy and set realistic goals. Your health care provider can help you to make an activity plan that works for you. Exercise regularly as directed by your health care provider. This may include:  Doing resistance training twice each week, such as: ? Push-ups. ? Sit-ups. ? Lifting weights. ? Using resistance bands.  Doing a given intensity of exercise for a given amount of time. Choose from these options: ? 150 minutes of moderate-intensity exercise every week. ? 75 minutes of vigorous-intensity exercise every week. ? A mix of moderate-intensity and vigorous-intensity exercise every week.  Children, pregnant women, people who are out of shape, people who are overweight, and older adults may need to consult a health care provider for individual recommendations. If you have any sort of medical condition, be sure to consult your health care provider before starting a new exercise program. What are some activities that can help me to lose weight?  Walking at a rate of at least 4.5 miles an hour.  Jogging or running at a rate of 5 miles per hour.  Biking at a rate of at least 10 miles per hour.  Lap swimming.  Roller-skating or in-line skating.  Cross-country skiing.  Vigorous competitive sports, such as football, basketball, and soccer.  Jumping rope.  Aerobic dancing. How can I be more active in my day-to-day  activities?  Use the stairs instead of the elevator.  Take a walk during your lunch break.  If you drive, park your car farther away from work or school.  If you take public transportation, get off one stop early and walk the rest of the way.  Make all of your phone calls while standing up and walking around.  Get up, stretch, and walk around every 30 minutes throughout the day. What guidelines should I follow while exercising?  Do not exercise so much that you hurt yourself, feel dizzy, or get very short of breath.  Consult your health care provider prior to starting a new exercise program.  Wear comfortable clothes and shoes with good support.  Drink plenty of water while you exercise to prevent dehydration or heat stroke. Body water is lost during exercise and must be replaced.  Work out until you breathe faster and your heart beats faster. This information is not intended to replace advice given to you by your health care provider. Make sure you discuss any questions you have with your health care provider. Document Released: 12/23/2010 Document Revised: 04/27/2016 Document Reviewed: 04/23/2014 Elsevier Interactive Patient Education  2018 [place] Eating Plan DASH stands for "Dietary Approaches to Stop Hypertension." The DASH eating plan is a healthy eating plan that has been shown to reduce high blood pressure (hypertension). It may also reduce your risk for type 2 diabetes, heart disease, and stroke. The DASH eating plan may also help with weight loss. What are tips for following this plan? General guidelines  Avoid eating more than 2,300  mg (milligrams) of salt (sodium) a day. If you have hypertension, you may need to reduce your sodium intake to 1,500 mg a day.  Limit alcohol intake to no more than 1 drink a day for nonpregnant women and 2 drinks a day for men. One drink equals 12 oz of beer, 5 oz of wine, or 1 oz of hard liquor.  Work with your health care  provider to maintain a healthy body weight or to lose weight. Ask what an ideal weight is for you.  Get at least 30 minutes of exercise that causes your heart to beat faster (aerobic exercise) most days of the week. Activities may include walking, swimming, or biking.  Work with your health care provider or diet and nutrition specialist (dietitian) to adjust your eating plan to your individual calorie needs. Reading food labels  Check food labels for the amount of sodium per serving. Choose foods with less than 5 percent of the Daily Value of sodium. Generally, foods with less than 300 mg of sodium per serving fit into this eating plan.  To find whole grains, look for the word "whole" as the first word in the ingredient list. Shopping  Buy products labeled as "low-sodium" or "no salt added."  Buy fresh foods. Avoid canned foods and premade or frozen meals. Cooking  Avoid adding salt when cooking. Use salt-free seasonings or herbs instead of table salt or sea salt. Check with your health care provider or pharmacist before using salt substitutes.  Do not fry foods. Cook foods using healthy methods such as baking, boiling, grilling, and broiling instead.  Cook with heart-healthy oils, such as olive, canola, soybean, or sunflower oil. Meal planning   Eat a balanced diet that includes: ? 5 or more servings of fruits and vegetables each day. At each meal, try to fill half of your plate with fruits and vegetables. ? Up to 6-8 servings of whole grains each day. ? Less than 6 oz of lean meat, poultry, or fish each day. A 3-oz serving of meat is about the same size as a deck of cards. One egg equals 1 oz. ? 2 servings of low-fat dairy each day. ? A serving of nuts, seeds, or beans 5 times each week. ? Heart-healthy fats. Healthy fats called Omega-3 fatty acids are found in foods such as flaxseeds and coldwater fish, like sardines, salmon, and mackerel.  Limit how much you eat of the  following: ? Canned or prepackaged foods. ? Food that is high in trans fat, such as fried foods. ? Food that is high in saturated fat, such as fatty meat. ? Sweets, desserts, sugary drinks, and other foods with added sugar. ? Full-fat dairy products.  Do not salt foods before eating.  Try to eat at least 2 vegetarian meals each week.  Eat more home-cooked food and less restaurant, buffet, and fast food.  When eating at a restaurant, ask that your food be prepared with less salt or no salt, if possible. What foods are recommended? The items listed may not be a complete list. Talk with your dietitian about what dietary choices are best for you. Grains Whole-grain or whole-wheat bread. Whole-grain or whole-wheat pasta. Brown rice. [place]row. Bulgur. Whole-grain and low-sodium cereals. Pita bread. Low-fat, low-sodium crackers. Whole-wheat flour tortillas. Vegetables Fresh or frozen vegetables (raw, steamed, roasted, or grilled). Low-sodium or reduced-sodium tomato and vegetable juice. Low-sodium or reduced-sodium tomato sauce and tomato paste. Low-sodium or reduced-sodium canned vegetables. Fruits All fresh, dried, or frozen  fruit. Canned fruit in natural juice (without added sugar). Meat and other protein foods Skinless chicken or [place]. Ground chicken or [place]. Pork with fat trimmed off. Fish and seafood. Egg whites. Dried beans, peas, or lentils. Unsalted nuts, nut butters, and seeds. Unsalted canned beans. Lean cuts of beef with fat trimmed off. Low-sodium, lean deli meat. Dairy Low-fat (1%) or fat-free (skim) milk. Fat-free, low-fat, or reduced-fat cheeses. Nonfat, low-sodium ricotta or cottage cheese. Low-fat or nonfat yogurt. Low-fat, low-sodium cheese. Fats and oils Soft margarine without trans fats. Vegetable oil. Low-fat, reduced-fat, or light mayonnaise and salad dressings (reduced-sodium). Canola, safflower, olive, soybean, and sunflower oils. Avocado. Seasoning and other  foods Herbs. Spices. Seasoning mixes without salt. Unsalted popcorn and pretzels. Fat-free sweets. What foods are not recommended? The items listed may not be a complete list. Talk with your dietitian about what dietary choices are best for you. Grains Baked goods made with fat, such as croissants, muffins, or some breads. Dry pasta or rice meal packs. Vegetables Creamed or fried vegetables. Vegetables in a cheese sauce. Regular canned vegetables (not low-sodium or reduced-sodium). Regular canned tomato sauce and paste (not low-sodium or reduced-sodium). Regular tomato and vegetable juice (not low-sodium or reduced-sodium). Angie Fava. Olives. Fruits Canned fruit in a light or heavy syrup. Fried fruit. Fruit in cream or butter sauce. Meat and other protein foods Fatty cuts of meat. Ribs. Fried meat. Berniece Salines. Sausage. Bologna and other processed lunch meats. Salami. Fatback. Hotdogs. Bratwurst. Salted nuts and seeds. Canned beans with added salt. Canned or smoked fish. Whole eggs or egg yolks. Chicken or [place] with skin. Dairy Whole or 2% milk, cream, and half-and-half. Whole or full-fat cream cheese. Whole-fat or sweetened yogurt. Full-fat cheese. Nondairy creamers. Whipped toppings. Processed cheese and cheese spreads. Fats and oils Butter. Stick margarine. Lard. Shortening. Ghee. Bacon fat. Tropical oils, such as coconut, palm kernel, or palm oil. Seasoning and other foods Salted popcorn and pretzels. Onion salt, garlic salt, seasoned salt, table salt, and sea salt. Worcestershire sauce. Tartar sauce. Barbecue sauce. Teriyaki sauce. Soy sauce, including reduced-sodium. Steak sauce. Canned and packaged gravies. Fish sauce. Oyster sauce. Cocktail sauce. Horseradish that you find on the shelf. Ketchup. Mustard. Meat flavorings and tenderizers. Bouillon cubes. Hot sauce and Tabasco sauce. Premade or packaged marinades. Premade or packaged taco seasonings. Relishes. Regular salad dressings. Where to find  more information:  National Heart, Lung, and [place]: https://wilson-eaton.com/  American Heart Association: www.heart.org Summary  The DASH eating plan is a healthy eating plan that has been shown to reduce high blood pressure (hypertension). It may also reduce your risk for type 2 diabetes, heart disease, and stroke.  With the DASH eating plan, you should limit salt (sodium) intake to 2,300 mg a day. If you have hypertension, you may need to reduce your sodium intake to 1,500 mg a day.  When on the DASH eating plan, aim to eat more fresh fruits and vegetables, whole grains, lean proteins, low-fat dairy, and heart-healthy fats.  Work with your health care provider or diet and nutrition specialist (dietitian) to adjust your eating plan to your individual calorie needs. This information is not intended to replace advice given to you by your health care provider. Make sure you discuss any questions you have with your health care provider. Document Released: 11/09/2011 Document Revised: 11/13/2016 Document Reviewed: 11/13/2016 Elsevier Interactive Patient Education  [employer].

## 2018-09-23 NOTE — Progress Notes (Signed)
Subjective:  Patient ID: Ariel Lopez, female    DOB: [birth year]  Age: 57 y.o. MRN: [id number]  CC: Follow-up   HPI Ariel Lopez presents for follow-up of her hypertension.  She did not tolerate the chlorthalidone.  It had caused severe xerosis.  She has been able to lose weight.  Her blood pressure is has been running in the 130s over 80s.  She has had some episodes of lightheadedness after standing.  She was unable to fit to fill the Locaserin secondary to cost.  Her mom has a past medical history of cataracts diagnosed at age 60 and has lost vision in her left eye secondary to glaucoma.  Patient recently saw the ophthalmologist and was diagnosed also with cataracts.  Pressures in both eyes are normal.  She has a brand-new baby grandson.  Outpatient Medications Prior to Visit  Medication Sig Dispense Refill  . acetaminophen (TYLENOL) 500 MG tablet Take 1,000 mg every 6 (six) hours as needed by mouth for moderate pain.    . Cholecalciferol (VITAMIN D PO) Take 1 tablet by mouth daily.     Marland Kitchen ibuprofen (ADVIL,MOTRIN) 200 MG tablet Take 400 mg every 6 (six) hours as needed by mouth for moderate pain.    Marland Kitchen MAGNESIUM PO Take 1 tablet by mouth daily.    . Probiotic Product (PROBIOTIC PO) Take 1 tablet by mouth daily.    . progesterone (PROMETRIUM) 100 MG capsule TAKE 1 CAPSULE(100 MG) BY MOUTH DAILY 90 capsule 0  . diltiazem (DILACOR XR) 180 MG 24 hr capsule Take 1 capsule (180 mg total) by mouth daily. 90 capsule 1  . chlorthalidone (HYGROTON) 25 MG tablet Take 1 tablet (25 mg total) by mouth daily. 90 tablet 1   No facility-administered medications prior to visit.     ROS Review of Systems  Constitutional: Negative.   HENT: Negative.   Eyes: Negative.  Negative for photophobia and visual disturbance.  Respiratory: Negative.   Cardiovascular: Negative.   Gastrointestinal: Negative.   Endocrine: Negative for polyphagia and polyuria.  Genitourinary: Negative.   Musculoskeletal: Negative  for gait problem and joint swelling.  Skin: Positive for wound.  Allergic/Immunologic: Negative for immunocompromised state.  Neurological: Negative for headaches.  Hematological: Does not bruise/bleed easily.  Psychiatric/Behavioral: Negative.     Objective:  BP 138/80   Pulse 79   Ht 5' 1.5" (1.562 m)   Wt 160 lb (72.6 kg)   SpO2 96%   BMI 29.74 kg/m   BP Readings from Last 3 Encounters:  09/23/18 138/80  06/24/18 132/80  02/28/18 126/80    Wt Readings from Last 3 Encounters:  09/23/18 160 lb (72.6 kg)  06/24/18 162 lb 2 oz (73.5 kg)  02/28/18 168 lb 4 oz (76.3 kg)    Physical Exam  Constitutional: She is oriented to person, place, and time. She appears well-developed and well-nourished. No distress.  HENT:  Head: Normocephalic and atraumatic.  Right Ear: External ear normal.  Left Ear: External ear normal.  Eyes: Right eye exhibits no discharge. Left eye exhibits no discharge. No scleral icterus.  Neck: No JVD present. No tracheal deviation present.  Pulmonary/Chest: Effort normal.  Neurological: She is alert and oriented to person, place, and time.  Skin: Skin is warm and dry. She is not diaphoretic.     Psychiatric: She has a normal mood and affect. Her behavior is normal.    Lab Results  Component Value Date   WBC 3.8 (L) 02/01/2018   HGB 15.3 (H)  02/01/2018   HCT 43.9 02/01/2018   PLT 249.0 02/01/2018   GLUCOSE 102 (H) 02/01/2018   CHOL 159 02/01/2018   TRIG 89.0 02/01/2018   HDL 63.30 02/01/2018   LDLCALC 78 02/01/2018   ALT 17 02/01/2018   AST 16 02/01/2018   NA 141 02/01/2018   K 4.9 02/01/2018   CL 105 02/01/2018   CREATININE 0.70 02/01/2018   BUN 10 02/01/2018   CO2 28 02/01/2018   TSH 1.79 02/01/2018    Mm Diag Breast Tomo Bilateral  Result Date: 11/07/2017 CLINICAL DATA:  Patient presents complaining of left nipple itching with some aching that comes and goes, with symptoms over the last several weeks.No reported lumps. No other  complaints. Patient has a history of bilateral breast reduction surgery in 2012. EXAM: 2D DIGITAL DIAGNOSTIC BILATERAL MAMMOGRAM WITH CAD AND ADJUNCT TOMO COMPARISON:  Previous exam(s). ACR Breast Density Category b: There are scattered areas of fibroglandular density. FINDINGS: There are no discrete masses, areas of new architectural distortion, areas of significant asymmetry or suspicious calcifications. No mammographic change. Mammographic images were processed with CAD. IMPRESSION: No evidence of breast malignancy. RECOMMENDATION: Screening mammogram in one year.(Code:[id number]-B-01Y) I have discussed the findings and recommendations with the patient. Results were also provided in writing at the conclusion of the visit. If applicable, a reminder letter will be sent to the patient regarding the next appointment. BI-RADS CATEGORY  1: Negative. Electronically Signed   By: Lajean Manes M.D.   On: 11/07/2017 14:37    Assessment & Plan:   Ariel Lopez was seen today for follow-up.  Diagnoses and all orders for this visit:  Class 1 obesity due to excess calories without serious comorbidity with body mass index (BMI) of 31.0 to 31.9 in adult  Need for influenza vaccination -     Flu Vaccine QUAD 36+ mos IM  Palpitations -     diltiazem (DILACOR XR) 180 MG 24 hr capsule; Take 1 capsule (180 mg total) by mouth daily.  Laceration of right upper extremity, initial encounter -     Tdap vaccine greater than or equal to 7yo IM  Essential hypertension -     diltiazem (DILACOR XR) 180 MG 24 hr capsule; Take 1 capsule (180 mg total) by mouth daily.   I have discontinued Ariel Lopez's chlorthalidone. I am also having her maintain her Cholecalciferol (VITAMIN D PO), Probiotic Product (PROBIOTIC PO), MAGNESIUM PO, ibuprofen, acetaminophen, progesterone, and diltiazem.  Meds ordered this encounter  Medications  . diltiazem (DILACOR XR) 180 MG 24 hr capsule    Sig: Take 1 capsule (180 mg total) by mouth daily.     Dispense:  90 capsule    Refill:  1   Patient will continue the diltiazem at its present dose.  She will continue weight loss efforts.  She will check with the manufacturer of her coupon for the Locaserin.  If the pharmacy does not monitor her old prescription I told her I would refill another.  Follow-up: Return in about 6 months (around 03/25/2019).  Libby Maw, MD

## 2018-09-24 ENCOUNTER — Ambulatory Visit: Payer: 59 | Admitting: Family Medicine

## 2018-10-03 ENCOUNTER — Ambulatory Visit (INDEPENDENT_AMBULATORY_CARE_PROVIDER_SITE_OTHER): Payer: 59 | Admitting: Obstetrics & Gynecology

## 2018-10-03 ENCOUNTER — Encounter: Payer: Self-pay | Admitting: Obstetrics & Gynecology

## 2018-10-03 VITALS — BP 154/94 | Ht 61.5 in | Wt 161.0 lb

## 2018-10-03 DIAGNOSIS — Z01419 Encounter for gynecological examination (general) (routine) without abnormal findings: Secondary | ICD-10-CM | POA: Diagnosis not present

## 2018-10-03 DIAGNOSIS — Z780 Asymptomatic menopausal state: Secondary | ICD-10-CM

## 2018-10-03 DIAGNOSIS — B373 Candidiasis of vulva and vagina: Secondary | ICD-10-CM

## 2018-10-03 DIAGNOSIS — B3731 Acute candidiasis of vulva and vagina: Secondary | ICD-10-CM

## 2018-10-03 MED ORDER — FLUCONAZOLE 150 MG PO TABS: 150.0000 mg | ORAL_TABLET | Freq: Every day | ORAL | 1 refills | Status: DC

## 2018-10-03 NOTE — Progress Notes (Signed)
Ariel Lopez [birth year] [id number]   History:    57 y.o. G2P2L2 Married  RP:  Established patient presenting for annual gyn exam   HPI: Menopause, well on no HRT.  Mild tolerable hot flushes.  No PMB.  No pelvic pain.  No pain with IC. C/O vaginal itching with mild discharge.  Urine/BMs wnl.  Breasts wnl.  BMI 29.93.  Physically active.  Health Labs with Fam MD.  Past medical history,surgical history, family history and social history were all reviewed and documented in the EPIC chart.  Gynecologic History No LMP recorded. Patient has had an ablation. Contraception: post menopausal status Last Pap: 05/2016. Results were: Negative/HPV HR neg Last mammogram: 11/2017. Results were: Negative Bone Density: Never Colonoscopy: 2017 Benign Polyp  Obstetric History OB History  Gravida Para Term Preterm AB Living  2         2  SAB TAB Ectopic Multiple Live Births      0        # Outcome Date GA Lbr Len/2nd Weight Sex Delivery Anes PTL Lv  2 Gravida           1 Gravida              ROS: A ROS was performed and pertinent positives and negatives are included in the history.  GENERAL: No fevers or chills. HEENT: No change in vision, no earache, sore throat or sinus congestion. NECK: No pain or stiffness. CARDIOVASCULAR: No chest pain or pressure. No palpitations. PULMONARY: No shortness of breath, cough or wheeze. GASTROINTESTINAL: No abdominal pain, nausea, vomiting or diarrhea, melena or bright red blood per rectum. GENITOURINARY: No urinary frequency, urgency, hesitancy or dysuria. MUSCULOSKELETAL: No joint or muscle pain, no back pain, no recent trauma. DERMATOLOGIC: No rash, no itching, no lesions. ENDOCRINE: No polyuria, polydipsia, no heat or cold intolerance. No recent change in weight. HEMATOLOGICAL: No anemia or easy bruising or bleeding. NEUROLOGIC: No headache, seizures, numbness, tingling or weakness. PSYCHIATRIC: No depression, no loss of interest in normal activity or change in  sleep pattern.     Exam:   There were no vitals taken for this visit.  There is no height or weight on file to calculate BMI.  General appearance : Well developed well nourished female. No acute distress HEENT: Eyes: no retinal hemorrhage or exudates,  Neck supple, trachea midline, no carotid bruits, no thyroidmegaly Lungs: Clear to auscultation, no rhonchi or wheezes, or rib retractions  Heart: Regular rate and rhythm, no murmurs or gallops Breast:Examined in sitting and supine position were symmetrical in appearance, no palpable masses or tenderness,  no skin retraction, no nipple inversion, no nipple discharge, no skin discoloration, no axillary or supraclavicular lymphadenopathy Abdomen: no palpable masses or tenderness, no rebound or guarding Extremities: no edema or skin discoloration or tenderness  Pelvic: Vulva: Normal             Vagina: No gross lesions or discharge  Cervix: No gross lesions or discharge.  Pap reflex done  Uterus  AV, normal size, shape and consistency, non-tender and mobile  Adnexa  Without masses or tenderness  Anus: Normal   Assessment/Plan:  57 y.o. female for annual exam   1. Encounter for routine gynecological examination with Papanicolaou smear of cervix Normal gynecologic exam.  Pap reflex done.  Breast exam status post bilateral reduction normal.  Last mammogram December 2018 was negative.  Colonoscopy 2017.  Body mass index 29.93.  Recommend lower calorie/carb diet such as [place]  diet and aerobic activities 5 times a week with weightlifting every 2 days.  Health labs with family physician.  2. Menopause present Well on no hormone replacement therapy.  No postmenopausal bleeding.  Recommend vitamin D supplements, calcium intake of 1.5 g/day and regular weightbearing physical activity.  We will do a first bone density at the latest at age 72.  3. Yeast vaginitis Yeast vaginitis clinically and on exam.  Will treat with fluconazole 150 mg/tab.  1 tablet daily for 3 days.  Usage reviewed with patient and prescription sent to pharmacy.  May use probiotic or boric acid over-the-counter for prevention.  Other orders - fluconazole (DIFLUCAN) 150 MG tablet; Take 1 tablet (150 mg total) by mouth daily.  Princess Bruins MD, 4:02 PM 10/03/2018

## 2018-10-03 NOTE — Patient Instructions (Signed)
1. Encounter for routine gynecological examination with Papanicolaou smear of cervix Normal gynecologic exam.  Pap reflex done.  Breast exam status post bilateral reduction normal.  Last mammogram December 2018 was negative.  Colonoscopy 2017.  Body mass index 29.93.  Recommend lower calorie/carb diet such as [place] and aerobic activities 5 times a week with weightlifting every 2 days.  Health labs with family physician.  2. Menopause present Well on no hormone replacement therapy.  No postmenopausal bleeding.  Recommend vitamin D supplements, calcium intake of 1.5 g/day and regular weightbearing physical activity.  We will do a first bone density at the latest at age 57.  3. Yeast vaginitis Yeast vaginitis clinically and on exam.  Will treat with fluconazole 150 mg/tab. 1 tablet daily for 3 days.  Usage reviewed with patient and prescription sent to pharmacy.  May use probiotic or boric acid over-the-counter for prevention.  Other orders - fluconazole (DIFLUCAN) 150 MG tablet; Take 1 tablet (150 mg total) by mouth daily.  Francenia, it was a pleasure seeing you today!  I will inform you of your results as soon as they are available.

## 2018-10-03 NOTE — Addendum Note (Signed)
Addended by: Thurnell Garbe A on: 10/03/2018 04:59 PM   Modules accepted: Orders

## 2018-10-07 LAB — PAP IG W/ RFLX HPV ASCU

## 2018-10-25 ENCOUNTER — Other Ambulatory Visit: Payer: Self-pay | Admitting: Obstetrics & Gynecology

## 2018-10-25 DIAGNOSIS — Z1231 Encounter for screening mammogram for malignant neoplasm of breast: Secondary | ICD-10-CM

## 2018-11-04 ENCOUNTER — Other Ambulatory Visit: Payer: Self-pay | Admitting: Obstetrics & Gynecology

## 2018-11-14 ENCOUNTER — Ambulatory Visit
Admission: RE | Admit: 2018-11-14 | Discharge: 2018-11-14 | Disposition: A | Discharge status: Home / Self Care | Payer: 59 | Source: Ambulatory Visit | Attending: Obstetrics & Gynecology | Admitting: Obstetrics & Gynecology

## 2018-11-14 DIAGNOSIS — Z1231 Encounter for screening mammogram for malignant neoplasm of breast: Secondary | ICD-10-CM

## 2018-12-17 ENCOUNTER — Other Ambulatory Visit: Payer: Self-pay | Admitting: Obstetrics & Gynecology

## 2019-01-23 ENCOUNTER — Ambulatory Visit (INDEPENDENT_AMBULATORY_CARE_PROVIDER_SITE_OTHER): Payer: 59 | Admitting: Family Medicine

## 2019-01-23 ENCOUNTER — Encounter: Payer: Self-pay | Admitting: Family Medicine

## 2019-01-23 VITALS — BP 138/90 | HR 85 | Ht 61.5 in | Wt 162.0 lb

## 2019-01-23 DIAGNOSIS — I10 Essential (primary) hypertension: Secondary | ICD-10-CM

## 2019-01-23 DIAGNOSIS — R002 Palpitations: Secondary | ICD-10-CM | POA: Diagnosis not present

## 2019-01-23 MED ORDER — DILTIAZEM HCL ER 240 MG PO CP24: 240.0000 mg | ORAL_CAPSULE | Freq: Every day | ORAL | 1 refills | Status: DC

## 2019-01-23 NOTE — Progress Notes (Signed)
Established Patient Office Visit  Subjective:  Patient ID: Ariel Lopez, female    DOB: [birth year]  Age: 58 y.o. MRN: [id number]  CC:  Chief Complaint  Patient presents with  . Headache  . Hypertension    HPI Ariel Lopez presents for follow-up of her blood pressure.  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  She has been dealing with generalized headaches.  These headaches are different from her usual migraine.  She denies upper respiratory tract symptoms fevers rashes nausea or vomiting.  She has tolerated the 180 mg dose of Delacort well.  Past Medical History:  Diagnosis Date  . Depression   . Hypertension   . Kidney stone   . Migraine     Past Surgical History:  Procedure Laterality Date  . BREAST REDUCTION SURGERY  2012  . [place]  2013  . REDUCTION MAMMAPLASTY Bilateral 2012    Family History  Problem Relation Age of Onset  . Breast cancer Maternal Grandmother   . Breast cancer Paternal Grandmother   . Cervical cancer Sister   . Colon cancer Maternal Aunt   . Hypercholesterolemia Mother   . Hypertension Mother   . Hypercholesterolemia Father   . Hypertension Father     Social History   Socioeconomic History  . Marital status: Married    Spouse name: Not on file  . Number of children: Not on file  . Years of education: Not on file  . Highest education level: Not on file  Occupational History  . Not on file  Social Needs  . Financial resource strain: Not on file  . Food insecurity:    Worry: Not on file    Inability: Not on file  . Transportation needs:    Medical: Not on file    Non-medical: Not on file  Tobacco Use  . Smoking status: Former Research scientist (life sciences)  . Smokeless tobacco: Never Used  . Tobacco comment: SOCIAL SMOKER COLLEGE   Substance and Sexual Activity  . Alcohol use: Yes    Alcohol/week: 0.0 standard drinks    Comment: socailly   . Drug use: No  . Sexual activity: Yes   Partners: Male    Comment: 1ST INTERCOURSE- 86, PARTNERS-  6, MARRIED- 30 YRS   Lifestyle  . Physical activity:    Days per week: Not on file    Minutes per session: Not on file  . Stress: Not on file  Relationships  . Social connections:    Talks on phone: Not on file    Gets together: Not on file    Attends religious service: Not on file    Active member of club or organization: Not on file    Attends meetings of clubs or organizations: Not on file    Relationship status: Not on file  . Intimate partner violence:    Fear of current or ex partner: Not on file    Emotionally abused: Not on file    Physically abused: Not on file    Forced sexual activity: Not on file  Other Topics Concern  . Not on file  Social History Narrative  . Not on file    Outpatient Medications Prior to Visit  Medication Sig Dispense Refill  . acetaminophen (TYLENOL) 500 MG tablet Take 1,000 mg every 6 (six) hours as needed by mouth for moderate pain.    . Cholecalciferol (VITAMIN D PO) Take 1 tablet by mouth daily.     Marland Kitchen  ibuprofen (ADVIL,MOTRIN) 200 MG tablet Take 400 mg every 6 (six) hours as needed by mouth for moderate pain.    Marland Kitchen MAGNESIUM PO Take 1 tablet by mouth daily.    . Probiotic Product (PROBIOTIC PO) Take 1 tablet by mouth daily.    . progesterone (PROMETRIUM) 100 MG capsule TAKE 1 CAPSULE(100 MG) BY MOUTH DAILY 90 capsule 0  . diltiazem (DILACOR XR) 180 MG 24 hr capsule Take 1 capsule (180 mg total) by mouth daily. 90 capsule 1  . fluconazole (DIFLUCAN) 150 MG tablet Take 1 tablet (150 mg total) by mouth daily. 3 tablet 1   No facility-administered medications prior to visit.     Allergies  Allergen Reactions  . Gluten Meal Nausea Only    headache  . Lac Bovis Nausea Only    headache  . Peanut-Containing Drug Products   . Wheat Bran Nausea And Vomiting    headache    ROS Review of Systems  Constitutional: Negative for chills, diaphoresis, fatigue, fever and unexpected weight  change.  HENT: Negative.   Eyes: Negative for photophobia and visual disturbance.  Respiratory: Negative.   Cardiovascular: Negative.   Gastrointestinal: Negative.  Negative for nausea and vomiting.  Neurological: Positive for headaches. Negative for light-headedness and numbness.  Hematological: Does not bruise/bleed easily.  Psychiatric/Behavioral: Negative.       Objective:    Physical Exam  Constitutional: She is oriented to person, place, and time. She appears well-developed and well-nourished. No distress.  HENT:  Head: Normocephalic and atraumatic.  Right Ear: External ear normal.  Left Ear: External ear normal.  Eyes: Right eye exhibits no discharge. Left eye exhibits no discharge. No scleral icterus.  Neck: No JVD present. No tracheal deviation present.  Pulmonary/Chest: Effort normal. No stridor.  Neurological: She is alert and oriented to person, place, and time.  Skin: Skin is warm and dry. She is not diaphoretic.  Psychiatric: She has a normal mood and affect. Her behavior is normal.    BP 138/90   Pulse 85   Ht 5' 1.5" (1.562 m)   Wt 162 lb (73.5 kg)   SpO2 97%   BMI 30.11 kg/m  Wt Readings from Last 3 Encounters:  01/23/19 162 lb (73.5 kg)  10/03/18 161 lb (73 kg)  09/23/18 160 lb (72.6 kg)   BP Readings from Last 3 Encounters:  01/23/19 138/90  10/03/18 (!) 154/94  09/23/18 138/80   Guideline developer:  UpToDate (see UpToDate for funding source) Date Released: June 2014   There are no preventive care reminders to display for this patient.  There are no preventive care reminders to display for this patient.  Lab Results  Component Value Date   TSH 1.79 02/01/2018   Lab Results  Component Value Date   WBC 3.8 (L) 02/01/2018   HGB 15.3 (H) 02/01/2018   HCT 43.9 02/01/2018   MCV 93.5 02/01/2018   PLT 249.0 02/01/2018   Lab Results  Component Value Date   NA 141 02/01/2018   K 4.9 02/01/2018   CO2 28 02/01/2018   GLUCOSE 102 (H)  02/01/2018   BUN 10 02/01/2018   CREATININE 0.70 02/01/2018   BILITOT 0.6 02/01/2018   ALKPHOS 63 02/01/2018   AST 16 02/01/2018   ALT 17 02/01/2018   PROT 7.0 02/01/2018   ALBUMIN 4.1 02/01/2018   CALCIUM 9.9 02/01/2018   ANIONGAP 7 10/20/2017   GFR 92.15 02/01/2018   Lab Results  Component Value Date   CHOL 159 02/01/2018  Lab Results  Component Value Date   HDL 63.30 02/01/2018   Lab Results  Component Value Date   LDLCALC 78 02/01/2018   Lab Results  Component Value Date   TRIG 89.0 02/01/2018   Lab Results  Component Value Date   CHOLHDL 3 02/01/2018   No results found for: HGBA1C    Assessment & Plan:   Problem List Items Addressed This Visit      Cardiovascular and Mediastinum   Essential hypertension - Primary   Relevant Medications   diltiazem (DILACOR XR) 240 MG 24 hr capsule     Other   Palpitations   Relevant Medications   diltiazem (DILACOR XR) 240 MG 24 hr capsule      Meds ordered this encounter  Medications  . diltiazem (DILACOR XR) 240 MG 24 hr capsule    Sig: Take 1 capsule (240 mg total) by mouth daily.    Dispense:  100 capsule    Refill:  1    Follow-up: Return in about 3 months (around 04/23/2019), or if symptoms worsen or fail to improve.   I have increased her dose of Dilacor to the 240 mg pill.  She will take this for a few weeks and let me know if her headaches are not resolved.  Willing to increase to the 300 mg dose pending her results with the 240 mg pill.  She is scheduled for follow-up in May for a physical.

## 2019-02-13 ENCOUNTER — Telehealth: Payer: Self-pay | Admitting: Family Medicine

## 2019-02-13 DIAGNOSIS — I10 Essential (primary) hypertension: Secondary | ICD-10-CM

## 2019-02-13 NOTE — Telephone Encounter (Signed)
Please advise 

## 2019-02-13 NOTE — Telephone Encounter (Signed)
Copied from [place] [id number]. Topic: Quick Communication - See Telephone Encounter >> Feb 13, 2019  1:03 PM Antonieta Iba C wrote: CRM for notification. See Telephone encounter for: 02/13/19.  Pt called in to be advised by provider. Pt says that she was just started on a new medication diltiazem (DILACOR XR) 240 MG 24 hr capsule. Pt says that the medication is causing her to have migraines and it is hard for her to sleep completely through the night.   Pt would like to be advised further? Should she stop taking medication?    Please advise.

## 2019-02-14 MED ORDER — DILTIAZEM HCL ER 180 MG PO CP24: 180.0000 mg | ORAL_CAPSULE | Freq: Every day | ORAL | 0 refills | Status: DC

## 2019-04-02 ENCOUNTER — Encounter: Payer: 59 | Admitting: Family Medicine

## 2019-05-06 ENCOUNTER — Other Ambulatory Visit: Payer: Self-pay | Admitting: Family Medicine

## 2019-05-06 DIAGNOSIS — I10 Essential (primary) hypertension: Secondary | ICD-10-CM

## 2019-06-10 ENCOUNTER — Encounter: Payer: 59 | Admitting: Family Medicine

## 2019-07-17 ENCOUNTER — Ambulatory Visit (INDEPENDENT_AMBULATORY_CARE_PROVIDER_SITE_OTHER): Payer: 59 | Admitting: Family Medicine

## 2019-07-17 ENCOUNTER — Encounter: Payer: Self-pay | Admitting: Family Medicine

## 2019-07-17 VITALS — BP 128/78 | HR 63 | Temp 97.2°F | Ht 61.5 in | Wt 157.5 lb

## 2019-07-17 DIAGNOSIS — I10 Essential (primary) hypertension: Secondary | ICD-10-CM | POA: Diagnosis not present

## 2019-07-17 DIAGNOSIS — R002 Palpitations: Secondary | ICD-10-CM

## 2019-07-17 DIAGNOSIS — R079 Chest pain, unspecified: Secondary | ICD-10-CM

## 2019-07-17 NOTE — Progress Notes (Signed)
Established Patient Office Visit  Subjective:  Patient ID: Ariel Lopez, female    DOB: [birth year]  Age: 58 y.o. MRN: [id number]  CC:  Chief Complaint  Patient presents with  . Follow-up    HPI Ariel Lopez presents for follow-up of her hypertension and palpitations that have been well controlled with the Dilacor XR 180 mg daily.  She has been experiencing some swelling and pain in her feet.  This usually occurs by the end of the day.  She did experience one episode of left upper chest pain that radiated up into her neck.  This particular episode was not exertionally related.  Patient has no difficulty lying flat.  She had been on metoprolol in the past and had difficulty losing weight.  She did note increased palpitations after 2 glasses of wine.  She also has palpitations with hot flashes.  Last LDL was 78 with an HDL of 63.  Her son just finished a Designer, jewellery school and passed his boards.  He is working with a bone marrow transplant team.  Past Medical History:  Diagnosis Date  . Depression   . Hypertension   . Kidney stone   . Migraine     Past Surgical History:  Procedure Laterality Date  . BREAST REDUCTION SURGERY  2012  . [place]  2013  . REDUCTION MAMMAPLASTY Bilateral 2012    Family History  Problem Relation Age of Onset  . Breast cancer Maternal Grandmother   . Breast cancer Paternal Grandmother   . Cervical cancer Sister   . Colon cancer Maternal Aunt   . Hypercholesterolemia Mother   . Hypertension Mother   . Hypercholesterolemia Father   . Hypertension Father     Social History   Socioeconomic History  . Marital status: Married    Spouse name: Not on file  . Number of children: Not on file  . Years of education: Not on file  . Highest education level: Not on file  Occupational History  . Not on file  Social Needs  . Financial resource strain: Not on file  . Food insecurity    Worry: Not on file    Inability: Not on file  .  Transportation needs    Medical: Not on file    Non-medical: Not on file  Tobacco Use  . Smoking status: Former Research scientist (life sciences)  . Smokeless tobacco: Never Used  . Tobacco comment: SOCIAL SMOKER COLLEGE   Substance and Sexual Activity  . Alcohol use: Yes    Alcohol/week: 0.0 standard drinks    Comment: socailly   . Drug use: No  . Sexual activity: Yes    Partners: Male    Comment: 1ST INTERCOURSE- 60, PARTNERS-  6, MARRIED- 30 YRS   Lifestyle  . Physical activity    Days per week: Not on file    Minutes per session: Not on file  . Stress: Not on file  Relationships  . Social Herbalist on phone: Not on file    Gets together: Not on file    Attends religious service: Not on file    Active member of club or organization: Not on file    Attends meetings of clubs or organizations: Not on file    Relationship status: Not on file  . Intimate partner violence    Fear of current or ex partner: Not on file    Emotionally abused: Not on file    Physically abused: Not on file  Forced sexual activity: Not on file  Other Topics Concern  . Not on file  Social History Narrative  . Not on file    Outpatient Medications Prior to Visit  Medication Sig Dispense Refill  . acetaminophen (TYLENOL) 500 MG tablet Take 1,000 mg every 6 (six) hours as needed by mouth for moderate pain.    . Cholecalciferol (VITAMIN D PO) Take 1 tablet by mouth daily.     Marland Kitchen DILT-XR 180 MG 24 hr capsule TAKE 1 CAPSULE(180 MG) BY MOUTH DAILY 90 capsule 1  . ibuprofen (ADVIL,MOTRIN) 200 MG tablet Take 400 mg every 6 (six) hours as needed by mouth for moderate pain.    Marland Kitchen MAGNESIUM PO Take 1 tablet by mouth daily.    . Probiotic Product (PROBIOTIC PO) Take 1 tablet by mouth daily.    . progesterone (PROMETRIUM) 100 MG capsule TAKE 1 CAPSULE(100 MG) BY MOUTH DAILY 90 capsule 0   No facility-administered medications prior to visit.     Allergies  Allergen Reactions  . Gluten Meal Nausea Only    headache  .  Lac Bovis Nausea Only    headache  . Peanut-Containing Drug Products   . Wheat Bran Nausea And Vomiting    headache    ROS Review of Systems  Constitutional: Negative for chills, diaphoresis, fatigue, fever and unexpected weight change.  HENT: Negative.   Eyes: Negative for photophobia and visual disturbance.  Respiratory: Negative for chest tightness, shortness of breath and wheezing.   Cardiovascular: Positive for chest pain, palpitations and leg swelling.  Gastrointestinal: Negative for nausea and vomiting.  Genitourinary: Negative.   Musculoskeletal: Negative for gait problem and joint swelling.  Allergic/Immunologic: Negative for immunocompromised state.  Neurological: Negative for light-headedness and headaches.  Hematological: Negative.   Psychiatric/Behavioral: Negative.       Objective:    Physical Exam  Constitutional: She is oriented to person, place, and time. She appears well-developed and well-nourished. No distress.  HENT:  Head: Normocephalic and atraumatic.  Right Ear: External ear normal.  Left Ear: External ear normal.  Mouth/Throat: Oropharynx is clear and moist.  Eyes: Conjunctivae are normal. Right eye exhibits no discharge. Left eye exhibits no discharge. No scleral icterus.  Neck: Neck supple. No JVD present. No tracheal deviation present. No thyromegaly present.  Cardiovascular: Normal rate, regular rhythm and normal heart sounds.  Pulmonary/Chest: Effort normal and breath sounds normal. No stridor.  Musculoskeletal:        General: No edema.       Feet:  Lymphadenopathy:    She has no cervical adenopathy.  Neurological: She is alert and oriented to person, place, and time.  Skin: Skin is warm and dry. She is not diaphoretic.  Psychiatric: She has a normal mood and affect. Her behavior is normal.    BP 128/78   Pulse 63   Temp (!) 97.2 F (36.2 C) (Temporal)   Ht 5' 1.5" (1.562 m)   Wt 157 lb 8 oz (71.4 kg)   SpO2 96%   BMI 29.28 kg/m   Wt Readings from Last 3 Encounters:  07/17/19 157 lb 8 oz (71.4 kg)  01/23/19 162 lb (73.5 kg)  10/03/18 161 lb (73 kg)   BP Readings from Last 3 Encounters:  07/17/19 128/78  01/23/19 138/90  10/03/18 (!) 154/94   Guideline developer:  UpToDate (see UpToDate for funding source) Date Released: June 2014  Health Maintenance Due  Topic Date Due  . HIV Screening  07/16/1977  . INFLUENZA VACCINE  07/05/2019    There are no preventive care reminders to display for this patient.  Lab Results  Component Value Date   TSH 1.79 02/01/2018   Lab Results  Component Value Date   WBC 3.8 (L) 02/01/2018   HGB 15.3 (H) 02/01/2018   HCT 43.9 02/01/2018   MCV 93.5 02/01/2018   PLT 249.0 02/01/2018   Lab Results  Component Value Date   NA 141 02/01/2018   K 4.9 02/01/2018   CO2 28 02/01/2018   GLUCOSE 102 (H) 02/01/2018   BUN 10 02/01/2018   CREATININE 0.70 02/01/2018   BILITOT 0.6 02/01/2018   ALKPHOS 63 02/01/2018   AST 16 02/01/2018   ALT 17 02/01/2018   PROT 7.0 02/01/2018   ALBUMIN 4.1 02/01/2018   CALCIUM 9.9 02/01/2018   ANIONGAP 7 10/20/2017   GFR 92.15 02/01/2018   Lab Results  Component Value Date   CHOL 159 02/01/2018   Lab Results  Component Value Date   HDL 63.30 02/01/2018   Lab Results  Component Value Date   LDLCALC 78 02/01/2018   Lab Results  Component Value Date   TRIG 89.0 02/01/2018   Lab Results  Component Value Date   CHOLHDL 3 02/01/2018   No results found for: HGBA1C    Assessment & Plan:   Problem List Items Addressed This Visit      Cardiovascular and Mediastinum   Essential hypertension - Primary   Relevant Orders   Ambulatory referral to Cardiology     Other   Palpitations   Relevant Orders   Ambulatory referral to Cardiology    Other Visit Diagnoses    Chest pain, unspecified type       Relevant Orders   Ambulatory referral to Cardiology      No orders of the defined types were placed in this encounter.    Follow-up: No follow-ups on file.    Cardiology referral for stress echo with anticipated reassurance.  She will follow-up after the study.

## 2019-07-31 ENCOUNTER — Encounter: Payer: Self-pay | Admitting: Cardiology

## 2019-07-31 ENCOUNTER — Ambulatory Visit (INDEPENDENT_AMBULATORY_CARE_PROVIDER_SITE_OTHER): Payer: 59 | Admitting: Cardiology

## 2019-07-31 ENCOUNTER — Other Ambulatory Visit: Payer: Self-pay

## 2019-07-31 VITALS — BP 140/74 | HR 98 | Ht 62.0 in | Wt 160.0 lb

## 2019-07-31 DIAGNOSIS — R002 Palpitations: Secondary | ICD-10-CM

## 2019-07-31 DIAGNOSIS — R0609 Other forms of dyspnea: Secondary | ICD-10-CM | POA: Diagnosis not present

## 2019-07-31 DIAGNOSIS — R600 Localized edema: Secondary | ICD-10-CM

## 2019-07-31 DIAGNOSIS — I10 Essential (primary) hypertension: Secondary | ICD-10-CM

## 2019-07-31 NOTE — Patient Instructions (Signed)
Medication Instructions:  No medication changes today.   If you need a refill on your cardiac medications before your next appointment, please call your pharmacy.   Lab work: Your physician recommends that you return for lab work today: BMET, TSH  If you have labs (blood work) drawn today and your tests are completely normal, you will receive your results only by: Marland Kitchen MyChart Message (if you have MyChart) OR . A paper copy in the mail If you have any lab test that is abnormal or we need to change your treatment, we will call you to review the results.  Testing/Procedures: Your physician has requested that you have an echocardiogram. Echocardiography is a painless test that uses sound waves to create images of your heart. It provides your doctor with information about the size and shape of your heart and how well your heart's chambers and valves are working. This procedure takes approximately one hour. There are no restrictions for this procedure.  Plan for 14 day Zio monitor. This allows your heart rhythm and rate to be continually monitored for 14 days. Please mail back after two weeks. Keep list of symptoms in the diary.   Follow-Up: At [facility], you and your health needs are our priority.  As part of our continuing mission to provide you with exceptional heart care, we have created designated Provider Care Teams.  These Care Teams include your primary Cardiologist (physician) and Advanced Practice Providers (APPs -  Physician Assistants and Nurse Practitioners) who all work together to provide you with the care you need, when you need it. You will need a follow up appointment in 1 months.  Please call our office 2 months in advance to schedule this appointment.  You may see Jenne Campus, MD or another member of our [facility] Provider Team in [place]: Shirlee More, MD . Jyl Heinz, MD . Berniece Salines, MD . Laurann Montana, NP  Any Other Special Instructions Will Be Listed  Below (If Applicable)  Medications and Palpitations *would recommend avoid using Benadryl on a regular basis  1. Avoid all over-the-counter antihistamines except Claritin/Loratadine and Zyrtec/Cetrizine. 2. Avoid all combination including cold sinus allergies flu decongestant and sleep medications 3. You can use Robitussin DM Mucinex and Mucinex DM for cough. 4. can use Tylenol aspirin ibuprofen and naproxen but no combinations such as sleep or sinus.  Recommend looking into the [place] device. This device monitors your heart rhythm using a small plate that you can keep at your bedside. Information included below.   [email or web address] [email or web address]        FDA-cleared, clinical grade mobile EKG monitor: Jodelle Red is the most clinically-validated mobile EKG used by the world's leading cardiac care medical professionals With Basic service, know instantly if your heart rhythm is normal or if atrial fibrillation is detected, and email the last single EKG recording to yourself or your doctor Premium service, available for purchase through the Kardia app for $9.99 per month or $99 per year, includes unlimited history and storage of your EKG recordings, a monthly EKG summary report to share with your doctor, along with the ability to track your blood pressure, activity and weight Includes one KardiaMobile phone clip FREE SHIPPING: Standard delivery 1-3 business days. Orders placed by 11:00am PST will ship that afternoon. Otherwise, will ship next business day. All orders ship via [employer] from [place], [place]   Echocardiogram An echocardiogram is a procedure that uses painless sound waves (ultrasound) to produce an image of the heart.  Images from an echocardiogram can provide important information about:  Signs of coronary artery disease (CAD).  Aneurysm detection. An aneurysm is a weak or damaged part of an artery wall that bulges out from the normal force of blood  pumping through the body.  Heart size and shape. Changes in the size or shape of the heart can be associated with certain conditions, including heart failure, aneurysm, and CAD.  Heart muscle function.  Heart valve function.  Signs of a past heart attack.  Fluid buildup around the heart.  Thickening of the heart muscle.  A tumor or infectious growth around the heart valves. Tell a health care provider about:  Any allergies you have.  All medicines you are taking, including vitamins, herbs, eye drops, creams, and over-the-counter medicines.  Any blood disorders you have.  Any surgeries you have had.  Any medical conditions you have.  Whether you are pregnant or may be pregnant. What are the risks? Generally, this is a safe procedure. However, problems may occur, including:  Allergic reaction to dye (contrast) that may be used during the procedure. What happens before the procedure? No specific preparation is needed. You may eat and drink normally. What happens during the procedure?   An IV tube may be inserted into one of your veins.  You may receive contrast through this tube. A contrast is an injection that improves the quality of the pictures from your heart.  A gel will be applied to your chest.  A wand-like tool (transducer) will be moved over your chest. The gel will help to transmit the sound waves from the transducer.  The sound waves will harmlessly bounce off of your heart to allow the heart images to be captured in real-time motion. The images will be recorded on a computer. The procedure may vary among health care providers and hospitals. What happens after the procedure?  You may return to your normal, everyday life, including diet, activities, and medicines, unless your health care provider tells you not to do that. Summary  An echocardiogram is a procedure that uses painless sound waves (ultrasound) to produce an image of the heart.  Images from an  echocardiogram can provide important information about the size and shape of your heart, heart muscle function, heart valve function, and fluid buildup around your heart.  You do not need to do anything to prepare before this procedure. You may eat and drink normally.  After the echocardiogram is completed, you may return to your normal, everyday life, unless your health care provider tells you not to do that. This information is not intended to replace advice given to you by your health care provider. Make sure you discuss any questions you have with your health care provider. Document Released: 11/17/2000 Document Revised: 03/13/2019 Document Reviewed: 12/23/2016 Elsevier Patient Education  2020 [employer].

## 2019-07-31 NOTE — Progress Notes (Signed)
Cardiology Clinic Note   Patient Name: Ariel Lopez Date of Encounter: 07/31/2019  Primary Care Provider:  Libby Maw, MD Primary Cardiologist:  Jenne Campus, MD  Patient Profile    Ariel Lopez is a 58 y.o. female with a hx of HTN, palpitations. Presents today for palpitations, chest pain.   Past Medical History    Past Medical History:  Diagnosis Date  . Depression   . Hypertension   . Kidney stone   . Migraine   . Palpitations    Past Surgical History:  Procedure Laterality Date  . BREAST REDUCTION SURGERY  2012  . [place]  2013  . REDUCTION MAMMAPLASTY Bilateral 2012    Allergies  Allergies  Allergen Reactions  . Gluten Meal Nausea Only    headache  . Lac Bovis Nausea Only    headache  . Peanut-Containing Drug Products   . Wheat Bran Nausea And Vomiting    headache    History of Present Illness   Ariel Lopez is a 58 y.o. female with a hx of HTN, palpitations presents today for consultation of palpitations and chest pain at request of her PCP Dr. Ethelene Hal.  Reports palpitations onset March 2019.  Reports that have improved since being started on Cardizem 180 mg by her primary care provider.  Reports palpitations occur approximately once a month.  They are noticed at night and associated with hot flashes.  They will wake her up from sleep and feel like "her heart is going to beat out of her chest ".  Will last up to 15 minutes. 1 month ago she had an episode of palpitations and reports that she felt these palpitations up into her neck.  Discussed proarrhythmic medications tells me she takes Benadryl at night to help her sleep.  Recommended stopping this as it can be a proarrhythmic.  Will give list of proarrhythmic drugs on her discharge packet for her reference.  Monitors blood pressure at home with wrist cuff.  We discussed that wrist cuff is often yield elevated numbers and recommended she purchase an arm cuff.  Reports to me her home  blood pressures are typically 140s over 70s and 90s.   Reports she walks daily.  Walked 4 miles yesterday morning with no dyspnea, no chest pain.  Reports intermittent dyspnea with exertion with activities such as walking up a hill or walking up a flight of stairs.  Reports no recent worsening of this.   She was seen recently by integrative health physician and tells me that she had lots of blood work done and she anticipate those those results.  She has been started on a low sugar low heart carbohydrate diet.  EKGs/Labs/Other Studies Reviewed:   The following studies were reviewed today: No recent studies available for review.  Recent Labs: No results found for requested labs within last 8760 hours.  Recent Lipid Panel    Component Value Date/Time   CHOL 159 02/01/2018 0800   TRIG 89.0 02/01/2018 0800   HDL 63.30 02/01/2018 0800   CHOLHDL 3 02/01/2018 0800   VLDL 17.8 02/01/2018 0800   LDLCALC 78 02/01/2018 0800    Home Medications      Current Meds  Medication Sig  . acetaminophen (TYLENOL) 500 MG tablet Take 1,000 mg every 6 (six) hours as needed by mouth for moderate pain.  . Cholecalciferol (VITAMIN D PO) Take 1 tablet by mouth daily.   Marland Kitchen DILT-XR 180 MG 24 hr capsule TAKE 1 CAPSULE(180 MG) BY MOUTH  DAILY  . ibuprofen (ADVIL,MOTRIN) 200 MG tablet Take 400 mg every 6 (six) hours as needed by mouth for moderate pain.  Marland Kitchen MAGNESIUM PO Take 1 tablet by mouth daily.  . Probiotic Product (PROBIOTIC PO) Take 1 tablet by mouth daily.     Family History    Family History  Problem Relation Age of Onset  . Breast cancer Maternal Grandmother   . Breast cancer Paternal Grandmother   . Cervical cancer Sister   . Colon cancer Maternal Aunt   . Hypercholesterolemia Mother   . Hypertension Mother   . Hypercholesterolemia Father   . Hypertension Father    She indicated that her mother is alive. She indicated that her father is alive. She indicated that her sister is alive. She  indicated that her brother is alive. She indicated that her maternal grandmother is deceased. She indicated that her maternal grandfather is deceased. She indicated that her paternal grandmother is deceased. She indicated that her paternal grandfather is deceased. She indicated that the status of her maternal aunt is unknown.  Social History    Social History   Socioeconomic History  . Marital status: Married    Spouse name: Not on file  . Number of children: Not on file  . Years of education: Not on file  . Highest education level: Not on file  Occupational History  . Not on file  Social Needs  . Financial resource strain: Not on file  . Food insecurity    Worry: Not on file    Inability: Not on file  . Transportation needs    Medical: Not on file    Non-medical: Not on file  Tobacco Use  . Smoking status: Former Research scientist (life sciences)  . Smokeless tobacco: Never Used  . Tobacco comment: SOCIAL S[employer]GE   Substance and Sexual Activity  . Alcohol use: Yes    Alcohol/week: 0.0 standard drinks    Comment: socailly   . Drug use: No  . Sexual activity: Yes    Partners: Male    Comment: 1ST INTERCOURSE- 42, PARTNERS-  6, MARRIED- 30 YRS   Lifestyle  . Physical activity    Days per week: Not on file    Minutes per session: Not on file  . Stress: Not on file  Relationships  . Social Herbalist on phone: Not on file    Gets together: Not on file    Attends religious service: Not on file    Active member of club or organization: Not on file    Attends meetings of clubs or organizations: Not on file    Relationship status: Not on file  . Intimate partner violence    Fear of current or ex partner: Not on file    Emotionally abused: Not on file    Physically abused: Not on file    Forced sexual activity: Not on file  Other Topics Concern  . Not on file  Social History Narrative  . Not on file     Review of Systems    Review of Systems  Constitution: Negative for  chills, fever and malaise/fatigue.  Cardiovascular: Positive for dyspnea on exertion, leg swelling and palpitations. Negative for chest pain, near-syncope and syncope.  Respiratory: Negative for cough and shortness of breath.   Gastrointestinal: Negative for nausea and vomiting.  Neurological: Negative for dizziness, light-headedness and weakness.    Physical Exam    VS:  BP 140/74   Pulse 98   Ht 5'  2" (1.575 m)   Wt 160 lb (72.6 kg)   SpO2 99%   BMI 29.26 kg/m  , BMI Body mass index is 29.26 kg/m. GEN: Well nourished, well developed, in no acute distress. HEENT: Normal. Neck: Supple, no JVD, carotid bruits, or masses. Cardiac: RRR, no murmurs, rubs, or gallops. No clubbing, cyanosis, edema.  Radials/DP/PT 2+ and equal bilaterally.  Respiratory:  Respirations regular and unlabored, clear to auscultation bilaterally. GI: Soft, nontender, nondistended, BS + x 4. MS: No deformity or atrophy. Skin: Warm and dry, no rash. Neuro:  Strength and sensation are intact. Psych: Normal affect.  Accessory Clinical Findings    ECG personally reviewed by me today-sinus rhythm without acute ST/T wave changes.- No acute changes  Assessment & Plan   1. Palpitations -Reports onset March 2019 for which she was started on diltiazem by her PCP.  Episodes occur at night, wake her from sleep, associated with hot flashes and shortness of breath, feels like "heart beating out of chest".  Tells me she had blood work including thyroid studies done with integrative health doctor recently and politely declines to have the studies repeated today.  I have asked her to have them sent to [place] for review.  Denies recent stress, excessive caffeine intake.  Takes Benadryl intermittently first and we medication.  Episode description sounds like short burst of SVT vs symptomatic PVC.  Plan for 7 days ZIO and echocardiogram.    Discussed purchase of Kardia device as her palpitations only occur about 1 time per month.     Pending results of ZIO monitor could consider short acting Cardizem vs Metoprolol as needed for breakthrough palpitations.  List of proarrhythmic medications to avoid provided for her.  2. DOE -Reports intermittent dyspnea on exertion.  Tells me she walks daily chest pain.  Has been trying to eat a healthy diet to lose weight, recently started low sugar low-carb diet session.  EKG today sinus rhythm with no acute ST/T wave abnormality.  Plan for echocardiogram as above.  Would not pursue ischemic evaluation at this time.  3. Lower extremity edema -reports lower extremity edema 1 week ago in her bilateral feet.  Plan for echocardiogram as above.  Euvolemic on exam today.  Etiology of venous insufficiency vs sodium intake.  4. HTN -checks BP at home on wrist cuff with readings 140s over 70s to 90s.  Recommended she purchase an arm cuff as they are more accurate.  Continue diltiazem as prescribed by her PCP. Goal BP <130/80.   Jenne Campus, MD 07/31/2019, 12:17 PM

## 2019-08-07 ENCOUNTER — Ambulatory Visit (HOSPITAL_BASED_OUTPATIENT_CLINIC_OR_DEPARTMENT_OTHER)
Admission: RE | Admit: 2019-08-07 | Discharge: 2019-08-07 | Disposition: A | Discharge status: Home / Self Care | Payer: 59 | Source: Ambulatory Visit | Attending: Family | Admitting: Family

## 2019-08-07 DIAGNOSIS — R002 Palpitations: Secondary | ICD-10-CM | POA: Insufficient documentation

## 2019-08-07 DIAGNOSIS — I10 Essential (primary) hypertension: Secondary | ICD-10-CM | POA: Diagnosis present

## 2019-08-07 NOTE — Progress Notes (Signed)
  Echocardiogram 2D Echocardiogram has been performed.  Ariel Lopez 08/07/2019, 2:02 PM

## 2019-08-08 ENCOUNTER — Telehealth: Payer: Self-pay | Admitting: Emergency Medicine

## 2019-08-08 NOTE — Telephone Encounter (Signed)
Left message for patient to return call regarding results  

## 2019-08-16 ENCOUNTER — Ambulatory Visit (INDEPENDENT_AMBULATORY_CARE_PROVIDER_SITE_OTHER): Payer: 59

## 2019-08-16 DIAGNOSIS — R002 Palpitations: Secondary | ICD-10-CM | POA: Diagnosis not present

## 2019-08-28 ENCOUNTER — Ambulatory Visit (INDEPENDENT_AMBULATORY_CARE_PROVIDER_SITE_OTHER): Payer: 59 | Admitting: Family

## 2019-08-28 ENCOUNTER — Encounter: Payer: Self-pay | Admitting: Family

## 2019-08-28 ENCOUNTER — Other Ambulatory Visit: Payer: Self-pay

## 2019-08-28 VITALS — BP 130/86 | HR 88 | Ht 62.0 in | Wt 157.0 lb

## 2019-08-28 DIAGNOSIS — R002 Palpitations: Secondary | ICD-10-CM | POA: Diagnosis not present

## 2019-08-28 DIAGNOSIS — I10 Essential (primary) hypertension: Secondary | ICD-10-CM | POA: Diagnosis not present

## 2019-08-28 NOTE — Progress Notes (Signed)
Office Visit    Patient Name: Ariel Lopez Date of Encounter: 08/28/2019  Primary Care Provider:  Libby Maw, MD Primary Cardiologist:  Jenne Campus, MD Electrophysiologist:  None   Chief Complaint    Ariel Lopez is a 58 y.o. female with a hx of HTN, palpitations presents today for follow up after recent echocardiogram and ZIO monitor.    Past Medical History    Past Medical History:  Diagnosis Date   Depression    Hypertension    Kidney stone    Migraine    Palpitations    Past Surgical History:  Procedure Laterality Date   BREAST REDUCTION SURGERY  2012   NOVASURE ABLATION  2013   REDUCTION MAMMAPLASTY Bilateral 2012   Allergies  Allergies  Allergen Reactions   Gluten Meal Nausea Only    headache   Lac Bovis Nausea Only    headache   Peanut-Containing Drug Products    Wheat Bran Nausea And Vomiting    headache    History of Present Illness    Ariel Lopez is a 58 y.o. female with a hx of HTN, palpitations last seen by Dr. Agustin Cree 07/31/19. At that time she was recommended ZIO monitor and echocardiogram. Palpitations onset 02/2018 and somewhat improved after initiation of Cardizem 180mg  by her PCP.  Walks daily around 4 miles without chest pain nor DOE. Recent changes to her diet after recently being seen by integrative health physician - she has been on a no sugar diet for one month and reports her energy level has improved. She is being treated diflucan for overgrowth of candidiasis by her integrative health doctor.   Echo 08/07/19 with normal EF 60-65%, impaired LV relaxation, mild dilation ascending aorta 65mm, no significant valvular dysfunction.  We discussed that this mild dilation is likely due to previously uncontrolled BP. Tells me she was somewhat resistant to treating her blood pressure, but has been pleasantly surprised by how well she feels on Diltiazem. Her son recently finished NP school and has been encouraging her to  take her blood pressure seriously. Has not been checking her blood pressure consistently at home. I have encouraged her to purchase an arm cuff rather than wrist cuff and check three times per week. She will inform our office or her PCP for SBP consistently >130.   Tells me her palpitations have improved. She does not note anymore episodes of waking in the middle of the night with palpitations. Does feel her heart beating during hot flashes. Reviewed ZIO patch results which showed no atrial fibrillation, no pauses, and infrequent (<1%) PVCs and PACs. Her triggered events were often associated with NSR.   She denies DOE, LE edema. Tells me per her integrative health doctor and previous testing her body has difficulty removing toxins which is helped by vitamin B12 which she recently resumed. Her edema has since resolved.   She brought labs from her integrative health provider for review. Her lipid panel shows an LDL of 90 which is at goal of <100. Labs detailed below.  EKGs/Labs/Other Studies Reviewed:   The following studies were reviewed today:  08/07/19 Echo 1. The left ventricle has normal systolic function with an ejection fraction of 60-65%. The cavity size was normal. Left ventricular diastolic Doppler parameters are consistent with impaired relaxation. No evidence of left ventricular regional wall  motion abnormalities.  2. The right ventricle has normal systolic function. The cavity was normal. There is no increase in right ventricular wall thickness.  3. There is mild dilatation of the ascending aorta measuring 38 mm.  4. The aortic valve is tricuspid. Aortic valve regurgitation is trivial by color flow Doppler. No stenosis of the aortic valve.  5. No evidence of mitral valve stenosis.  ZIO 08/16/19 Baseline rhythm: Sinus   Minimum heart rate: 45 BPM.  Average heart rate: 70 BPM.  Maximal heart rate at 154 BPM.   Atrial arrhythmia: Infrequent premature supraventricular beats     Ventricular arrhythmia: Infrequent premature ventricular beats   Conduction abnormality: None   Symptoms: Some trigger events for symptoms showing normal sinus rhythm     Conclusion:  Infrequent PVCs and APCs. No correlation between symptoms and arrhythmia   EKG:  No EKG today.   Recent Labs: Labs from integrative health physician 07/25/19:   LDL 90, HDL 68, triglycerides 106, total cholesterol 179  wbs 3.2, Hb 15.3, Hct 43.4, rbc 4.64  Creatinine 0.68, GFR 97, K 3.8, AST 24, ALT 23  HbA1c 5.3  TSH 2.02, T4 1.26  Vit D 79.2, B12 >2000  Ferritin 145  Recent Lipid Panel Labs from integrative health physician 07/25/19:   LDL 90, HDL 68, triglycerides 106, total cholesterol 179    Component Value Date/Time   CHOL 159 02/01/2018 0800   TRIG 89.0 02/01/2018 0800   HDL 63.30 02/01/2018 0800   CHOLHDL 3 02/01/2018 0800   VLDL 17.8 02/01/2018 0800   LDLCALC 78 02/01/2018 0800    Home Medications   Current Meds  Medication Sig   acetaminophen (TYLENOL) 500 MG tablet Take 1,000 mg every 6 (six) hours as needed by mouth for moderate pain.   Cholecalciferol (VITAMIN D PO) Take 1 tablet by mouth daily.    DILT-XR 180 MG 24 hr capsule TAKE 1 CAPSULE(180 MG) BY MOUTH DAILY   Fluconazole (DIFLUCAN PO) Take 1 tablet by mouth daily.   MAGNESIUM PO Take 1 tablet by mouth daily.   Probiotic Product (PROBIOTIC PO) Take 1 tablet by mouth daily.    Review of Systems      Review of Systems  Constitution: Negative for chills, fever and malaise/fatigue.  Cardiovascular: Positive for palpitations (improved from previous visit). Negative for chest pain, dyspnea on exertion and leg swelling.  Respiratory: Negative for cough, shortness of breath and wheezing.   Gastrointestinal: Negative for nausea and vomiting.   All other systems reviewed and are otherwise negative except as noted above.  Physical Exam    VS:  BP 130/86 (BP Location: Left Arm, Patient Position: Sitting,  Cuff Size: Normal)    Pulse 88    Ht 5\' 2"  (1.575 m)    Wt 157 lb (71.2 kg)    SpO2 98%    BMI 28.72 kg/m  , BMI Body mass index is 28.72 kg/m. GEN: Well nourished, well developed, in no acute distress. HEENT: normal. Neck: Supple, no JVD, carotid bruits, or masses. Cardiac: RRR, no murmurs, rubs, or gallops. No clubbing, cyanosis, edema.  Radials/DP/PT 2+ and equal bilaterally.  Respiratory:  Respirations regular and unlabored, clear to auscultation bilaterally. GI: Soft, nontender, nondistended, BS + x 4. MS: No deformity or atrophy. Skin: Warm and dry, no rash. Neuro:  Strength and sensation are intact. Psych: Normal affect..  Assessment & Plan    1. Palpitations - Onset 02/2018 somewhat improved on Diltiazem prescribed by PCP. Infrequent (<1%) PVC/PAC noted on Zio monitor 08/16/19. Echo 08/2019 EF 60-65%, impaired LV relaxation, mild dilateion ascending aorta 31mm. Reports her palpitations have improved since last office visit.  Majority of triggered events while wearing monitor were NSR. Reassurance provided that PVC/PAC were infrequent and not dangerous. Continue Diltiazem as prescribed by PCP. Encourage regular exercise and avoidance of caffeine.  2. HTN - BP stable today 130/86. Encouraged her to purchase an arm cuff and check daily. Goal BP <130/80. Anticipate she will remain at goal as she has made significant changes to her diet and walks 20 miles per week for exercise. If additional BP control needed, consider up titration of Diltiazem versus addition of low dose ARB.   3. Mild dilation of ascending aorta - Echo 08/2019 mild dilation of ascending aorta measuring 52mm. Discussed that this is likely due to previously uncontrolled BP. Reiterated goal BP <130/80. Plan for BP management, as above. Consider repeat imaging in 1 year by echo or CT.   Disposition: Follow up in 1 year(s) with Dr. Agustin Cree.   Loel Dubonnet, NP 08/28/2019, 1:26 PM

## 2019-08-28 NOTE — Patient Instructions (Addendum)
Medication Instructions:  No medication changes today.   If you need a refill on your cardiac medications before your next appointment, please call your pharmacy.   Lab work: No lab work today.  Your cholesterol numbers from the integrative health doctor look wonderful!!  If you have labs (blood work) drawn today and your tests are completely normal, you will receive your results only by: Marland Kitchen MyChart Message (if you have MyChart) OR . A paper copy in the mail If you have any lab test that is abnormal or we need to change your treatment, we will call you to review the results.  Testing/Procedures: We reviewed your echocardiogram and monitor.   Follow-Up: At [facility], you and your health needs are our priority.  As part of our continuing mission to provide you with exceptional heart care, we have created designated Provider Care Teams.  These Care Teams include your primary Cardiologist (physician) and Advanced Practice Providers (APPs -  Physician Assistants and Nurse Practitioners) who all work together to provide you with the care you need, when you need it. You will need a follow up appointment in 1 years.  You may see Jenne Campus, MD or another member of our [place] Provider Team in [place]: Shirlee More, MD . Jyl Heinz, MD  Any Other Special Instructions Will Be Listed Below (If Applicable).  Your ZIO monitor showed PVCs and PACs. These are early beats in the top and bottom chambers of your heart. They are not dangerous.   Your echocardiogram showed a normal pumping function. You had a mild dilation in your ascending aorta of 68mm. This is likely due to elevated blood pressure.   Purchase an arm blood pressure cuff. Our goal is a blood pressure is less than 130/80.

## 2019-11-05 ENCOUNTER — Other Ambulatory Visit: Payer: Self-pay

## 2019-11-05 DIAGNOSIS — I10 Essential (primary) hypertension: Secondary | ICD-10-CM

## 2019-11-05 MED ORDER — DILTIAZEM HCL ER 180 MG PO CP24: ORAL_CAPSULE | ORAL | 1 refills | Status: DC

## 2020-02-16 ENCOUNTER — Other Ambulatory Visit: Payer: Self-pay | Admitting: Obstetrics & Gynecology

## 2020-02-16 DIAGNOSIS — Z1231 Encounter for screening mammogram for malignant neoplasm of breast: Secondary | ICD-10-CM

## 2020-03-08 ENCOUNTER — Ambulatory Visit
Admission: RE | Admit: 2020-03-08 | Discharge: 2020-03-08 | Disposition: A | Discharge status: Home / Self Care | Payer: 59 | Source: Ambulatory Visit | Attending: Obstetrics & Gynecology | Admitting: Obstetrics & Gynecology

## 2020-03-08 ENCOUNTER — Other Ambulatory Visit: Payer: Self-pay

## 2020-03-08 DIAGNOSIS — Z1231 Encounter for screening mammogram for malignant neoplasm of breast: Secondary | ICD-10-CM

## 2020-03-09 ENCOUNTER — Encounter: Payer: 59 | Admitting: Family Medicine

## 2020-03-19 ENCOUNTER — Encounter: Payer: 59 | Admitting: Family Medicine

## 2020-03-29 ENCOUNTER — Other Ambulatory Visit: Payer: Self-pay

## 2020-03-30 ENCOUNTER — Ambulatory Visit (INDEPENDENT_AMBULATORY_CARE_PROVIDER_SITE_OTHER): Payer: 59 | Admitting: Family Medicine

## 2020-03-30 ENCOUNTER — Encounter: Payer: Self-pay | Admitting: Family Medicine

## 2020-03-30 VITALS — BP 136/78 | HR 80 | Temp 97.2°F | Ht 61.0 in | Wt 152.8 lb

## 2020-03-30 DIAGNOSIS — Z0000 Encounter for general adult medical examination without abnormal findings: Secondary | ICD-10-CM

## 2020-03-30 DIAGNOSIS — F5102 Adjustment insomnia: Secondary | ICD-10-CM

## 2020-03-30 DIAGNOSIS — I10 Essential (primary) hypertension: Secondary | ICD-10-CM

## 2020-03-30 LAB — URINALYSIS, ROUTINE W REFLEX MICROSCOPIC
Bilirubin Urine: NEGATIVE
Hgb urine dipstick: NEGATIVE
Ketones, ur: NEGATIVE
Leukocytes,Ua: NEGATIVE
Nitrite: NEGATIVE
RBC / HPF: NONE SEEN (ref 0–?)
Specific Gravity, Urine: <=1.005 — AB (ref 1.000–1.030)
Total Protein, Urine: NEGATIVE
Urine Glucose: NEGATIVE
Urobilinogen, UA: 0.2 (ref 0.0–1.0)
WBC, UA: NONE SEEN (ref 0–?)
pH: 6 (ref 5.0–8.0)

## 2020-03-30 LAB — COMPREHENSIVE METABOLIC PANEL
ALT: 25 U/L (ref 0–35)
AST: 26 U/L (ref 0–37)
Albumin: 4.3 g/dL (ref 3.5–5.2)
Alkaline Phosphatase: 71 U/L (ref 39–117)
BUN: 10 mg/dL (ref 6–23)
CO2: 29 mEq/L (ref 19–32)
Calcium: 9.4 mg/dL (ref 8.4–10.5)
Chloride: 102 mEq/L (ref 96–112)
Creatinine, Ser: 0.67 mg/dL (ref 0.40–1.20)
GFR: 90.5 mL/min (ref >60.00)
Glucose, Bld: 99 mg/dL (ref 70–99)
Potassium: 3.7 mEq/L (ref 3.5–5.1)
Sodium: 137 mEq/L (ref 135–145)
Total Bilirubin: 0.4 mg/dL (ref 0.2–1.2)
Total Protein: 7 g/dL (ref 6.0–8.3)

## 2020-03-30 LAB — LIPID PANEL
Cholesterol: 182 mg/dL (ref 0–200)
HDL: 73 mg/dL (ref >39.00)
LDL Cholesterol: 91 mg/dL (ref 0–99)
NonHDL: 108.71
Total CHOL/HDL Ratio: 2
Triglycerides: 88 mg/dL (ref 0.0–149.0)
VLDL: 17.6 mg/dL (ref 0.0–40.0)

## 2020-03-30 LAB — CBC
HCT: 41.8 % (ref 36.0–46.0)
Hemoglobin: 14.7 g/dL (ref 12.0–15.0)
MCHC: 35.1 g/dL (ref 30.0–36.0)
MCV: 93.5 fl (ref 78.0–100.0)
Platelets: 249 10*3/uL (ref 150.0–400.0)
RBC: 4.46 Mil/uL (ref 3.87–5.11)
RDW: 12.5 % (ref 11.5–15.5)
WBC: 4.3 10*3/uL (ref 4.0–10.5)

## 2020-03-30 LAB — HEMOGLOBIN A1C: Hgb A1c MFr Bld: 5.2 % (ref 4.6–6.5)

## 2020-03-30 LAB — LDL CHOLESTEROL, DIRECT: Direct LDL: 88 mg/dL

## 2020-03-30 MED ORDER — TRAZODONE HCL 50 MG PO TABS: 25.0000 mg | ORAL_TABLET | Freq: Every evening | ORAL | 1 refills | Status: DC | PRN

## 2020-03-30 MED ORDER — DILTIAZEM HCL ER 180 MG PO CP24: ORAL_CAPSULE | ORAL | 1 refills | Status: DC

## 2020-03-30 MED ORDER — HYDROCHLOROTHIAZIDE 25 MG PO TABS: 25.0000 mg | ORAL_TABLET | Freq: Every day | ORAL | 1 refills | Status: DC

## 2020-03-30 NOTE — Patient Instructions (Signed)
Insomnia Insomnia is a sleep disorder that makes it difficult to fall asleep or stay asleep. Insomnia can cause fatigue, low energy, difficulty concentrating, mood swings, and poor performance at work or school. There are three different ways to classify insomnia:  Difficulty falling asleep.  Difficulty staying asleep.  Waking up too early in the morning. Any type of insomnia can be long-term (chronic) or short-term (acute). Both are common. Short-term insomnia usually lasts for three months or less. Chronic insomnia occurs at least three times a week for longer than three months. What are the causes? Insomnia may be caused by another condition, situation, or substance, such as:  Anxiety.  Certain medicines.  Gastroesophageal reflux disease (GERD) or other gastrointestinal conditions.  Asthma or other breathing conditions.  Restless legs syndrome, sleep apnea, or other sleep disorders.  Chronic pain.  Menopause.  Stroke.  Abuse of alcohol, tobacco, or illegal drugs.  Mental health conditions, such as depression.  Caffeine.  Neurological disorders, such as Alzheimer's disease.  An overactive thyroid (hyperthyroidism). Sometimes, the cause of insomnia may not be known. What increases the risk? Risk factors for insomnia include:  Gender. Women are affected more often than men.  Age. Insomnia is more common as you get older.  Stress.  Lack of exercise.  Irregular work schedule or working night shifts.  Traveling between different time zones.  Certain medical and mental health conditions. What are the signs or symptoms? If you have insomnia, the main symptom is having trouble falling asleep or having trouble staying asleep. This may lead to other symptoms, such as:  Feeling fatigued or having low energy.  Feeling nervous about going to sleep.  Not feeling rested in the morning.  Having trouble concentrating.  Feeling irritable, anxious, or depressed. How  is this diagnosed? This condition may be diagnosed based on:  Your symptoms and medical history. Your health care provider may ask about: ? Your sleep habits. ? Any medical conditions you have. ? Your mental health.  A physical exam. How is this treated? Treatment for insomnia depends on the cause. Treatment may focus on treating an underlying condition that is causing insomnia. Treatment may also include:  Medicines to help you sleep.  Counseling or therapy.  Lifestyle adjustments to help you sleep better. Follow these instructions at home: Eating and drinking   Limit or avoid alcohol, caffeinated beverages, and cigarettes, especially close to bedtime. These can disrupt your sleep.  Do not eat a large meal or eat spicy foods right before bedtime. This can lead to digestive discomfort that can make it hard for you to sleep. Sleep habits   Keep a sleep diary to help you and your health care provider figure out what could be causing your insomnia. Write down: ? When you sleep. ? When you wake up during the night. ? How well you sleep. ? How rested you feel the next day. ? Any side effects of medicines you are taking. ? What you eat and drink.  Make your bedroom a dark, comfortable place where it is easy to fall asleep. ? Put up shades or blackout curtains to block light from outside. ? Use a white noise machine to block noise. ? Keep the temperature cool.  Limit screen use before bedtime. This includes: ? Watching TV. ? Using your smartphone, tablet, or computer.  Stick to a routine that includes going to bed and waking up at the same times every day and night. This can help you fall asleep faster. Consider   making a quiet activity, such as reading, part of your nighttime routine.  Try to avoid taking naps during the day so that you sleep better at night.  Get out of bed if you are still awake after 15 minutes of trying to sleep. Keep the lights down, but try reading or  doing a quiet activity. When you feel sleepy, go back to bed. General instructions  Take over-the-counter and prescription medicines only as told by your health care provider.  Exercise regularly, as told by your health care provider. Avoid exercise starting several hours before bedtime.  Use relaxation techniques to manage stress. Ask your health care provider to suggest some techniques that may work well for you. These may include: ? Breathing exercises. ? Routines to release muscle tension. ? Visualizing peaceful scenes.  Make sure that you drive carefully. Avoid driving if you feel very sleepy.  Keep all follow-up visits as told by your health care provider. This is important. Contact a health care provider if:  You are tired throughout the day.  You have trouble in your daily routine due to sleepiness.  You continue to have sleep problems, or your sleep problems get worse. Get help right away if:  You have serious thoughts about hurting yourself or someone else. If you ever feel like you may hurt yourself or others, or have thoughts about taking your own life, get help right away. You can go to your nearest emergency department or call:  Your local emergency services (911 in the [place]  A suicide crisis helpline, such as the [facility] at 215-765-4447. This is open 24 hours a day. Summary  Insomnia is a sleep disorder that makes it difficult to fall asleep or stay asleep.  Insomnia can be long-term (chronic) or short-term (acute).  Treatment for insomnia depends on the cause. Treatment may focus on treating an underlying condition that is causing insomnia.  Keep a sleep diary to help you and your health care provider figure out what could be causing your insomnia. This information is not intended to replace advice given to you by your health care provider. Make sure you discuss any questions you have with your health care provider. Document  Revised: 11/02/2017 Document Reviewed: 08/30/2017 Elsevier Patient Education  2020 [place] Maintenance, Female Adopting a healthy lifestyle and getting preventive care are important in promoting health and wellness. Ask your health care provider about:  The right schedule for you to have regular tests and exams.  Things you can do on your own to prevent diseases and keep yourself healthy. What should I know about diet, weight, and exercise? Eat a healthy diet   Eat a diet that includes plenty of vegetables, fruits, low-fat dairy products, and lean protein.  Do not eat a lot of foods that are high in solid fats, added sugars, or sodium. Maintain a healthy weight Body mass index (BMI) is used to identify weight problems. It estimates body fat based on height and weight. Your health care provider can help determine your BMI and help you achieve or maintain a healthy weight. Get regular exercise Get regular exercise. This is one of the most important things you can do for your health. Most adults should:  Exercise for at least 150 minutes each week. The exercise should increase your heart rate and make you sweat (moderate-intensity exercise).  Do strengthening exercises at least twice a week. This is in addition to the moderate-intensity exercise.  Spend less time sitting.  Even light physical activity can be beneficial. Watch cholesterol and blood lipids Have your blood tested for lipids and cholesterol at 58 years of age, then have this test every 5 years. Have your cholesterol levels checked more often if:  Your lipid or cholesterol levels are high.  You are older than 58 years of age.  You are at high risk for heart disease. What should I know about cancer screening? Depending on your health history and family history, you may need to have cancer screening at various ages. This may include screening for:  Breast cancer.  Cervical cancer.  Colorectal  cancer.  Skin cancer.  Lung cancer. What should I know about heart disease, diabetes, and high blood pressure? Blood pressure and heart disease  High blood pressure causes heart disease and increases the risk of stroke. This is more likely to develop in people who have high blood pressure readings, are of African descent, or are overweight.  Have your blood pressure checked: ? Every 3-5 years if you are 49-71 years of age. ? Every year if you are 27 years old or older. Diabetes Have regular diabetes screenings. This checks your fasting blood sugar level. Have the screening done:  Once every three years after age 64 if you are at a normal weight and have a low risk for diabetes.  More often and at a younger age if you are overweight or have a high risk for diabetes. What should I know about preventing infection? Hepatitis B If you have a higher risk for hepatitis B, you should be screened for this virus. Talk with your health care provider to find out if you are at risk for hepatitis B infection. Hepatitis C Testing is recommended for:  Everyone born from [birth year] through 1964.  Anyone with known risk factors for hepatitis C. Sexually transmitted infections (STIs)  Get screened for STIs, including gonorrhea and chlamydia, if: ? You are sexually active and are younger than 58 years of age. ? You are older than 58 years of age and your health care provider tells you that you are at risk for this type of infection. ? Your sexual activity has changed since you were last screened, and you are at increased risk for chlamydia or gonorrhea. Ask your health care provider if you are at risk.  Ask your health care provider about whether you are at high risk for HIV. Your health care provider may recommend a prescription medicine to help prevent HIV infection. If you choose to take medicine to prevent HIV, you should first get tested for HIV. You should then be tested every 3 months for as long as  you are taking the medicine. Pregnancy  If you are about to stop having your period (premenopausal) and you may become pregnant, seek counseling before you get pregnant.  Take 400 to 800 micrograms (mcg) of folic acid every day if you become pregnant.  Ask for birth control (contraception) if you want to prevent pregnancy. Osteoporosis and menopause Osteoporosis is a disease in which the bones lose minerals and strength with aging. This can result in bone fractures. If you are 50 years old or older, or if you are at risk for osteoporosis and fractures, ask your health care provider if you should:  Be screened for bone loss.  Take a calcium or vitamin D supplement to lower your risk of fractures.  Be given hormone replacement therapy (HRT) to treat symptoms of menopause. Follow these instructions at home: Lifestyle  Do  not use any products that contain nicotine or tobacco, such as cigarettes, e-cigarettes, and chewing tobacco. If you need help quitting, ask your health care provider.  Do not use street drugs.  Do not share needles.  Ask your health care provider for help if you need support or information about quitting drugs. Alcohol use  Do not drink alcohol if: ? Your health care provider tells you not to drink. ? You are pregnant, may be pregnant, or are planning to become pregnant.  If you drink alcohol: ? Limit how much you use to 0-1 drink a day. ? Limit intake if you are breastfeeding.  Be aware of how much alcohol is in your drink. In the [place] one drink equals one 12 oz bottle of beer (355 mL), one 5 oz glass of wine (148 mL), or one 1 oz glass of hard liquor (44 mL). General instructions  Schedule regular health, dental, and eye exams.  Stay current with your vaccines.  Tell your health care provider if: ? You often feel depressed. ? You have ever been abused or do not feel safe at home. Summary  Adopting a healthy lifestyle and getting preventive care are  important in promoting health and wellness.  Follow your health care provider's instructions about healthy diet, exercising, and getting tested or screened for diseases.  Follow your health care provider's instructions on monitoring your cholesterol and blood pressure. This information is not intended to replace advice given to you by your health care provider. Make sure you discuss any questions you have with your health care provider. Document Revised: 11/13/2018 Document Reviewed: 11/13/2018 Elsevier Patient Education  [place].  Managing Your Hypertension Hypertension is commonly called high blood pressure. This is when the force of your blood pressing against the walls of your arteries is too strong. Arteries are blood vessels that carry blood from your heart throughout your body. Hypertension forces the heart to work harder to pump blood, and may cause the arteries to become narrow or stiff. Having untreated or uncontrolled hypertension can cause heart attack, stroke, kidney disease, and other problems. What are blood pressure readings? A blood pressure reading consists of a higher number over a lower number. Ideally, your blood pressure should be below 120/80. The first ("top") number is called the systolic pressure. It is a measure of the pressure in your arteries as your heart beats. The second ("bottom") number is called the diastolic pressure. It is a measure of the pressure in your arteries as the heart relaxes. What does my blood pressure reading mean? Blood pressure is classified into four stages. Based on your blood pressure reading, your health care provider may use the following stages to determine what type of treatment you need, if any. Systolic pressure and diastolic pressure are measured in a unit called mm Hg. Normal  Systolic pressure: below 123456.  Diastolic pressure: below 80. Elevated  Systolic pressure: [id number].  Diastolic pressure: below 80. Hypertension  stage 1  Systolic pressure: 0000000.  Diastolic pressure: [id number]. Hypertension stage 2  Systolic pressure: [id number] or above.  Diastolic pressure: 90 or above. What health risks are associated with hypertension? Managing your hypertension is an important responsibility. Uncontrolled hypertension can lead to:  A heart attack.  A stroke.  A weakened blood vessel (aneurysm).  Heart failure.  Kidney damage.  Eye damage.  Metabolic syndrome.  Memory and concentration problems. What changes can I make to manage my hypertension? Hypertension can be managed by making lifestyle changes and  possibly by taking medicines. Your health care provider will help you make a plan to bring your blood pressure within a normal range. Eating and drinking   Eat a diet that is high in fiber and potassium, and low in salt (sodium), added sugar, and fat. An example eating plan is called the DASH (Dietary Approaches to Stop Hypertension) diet. To eat this way: ? Eat plenty of fresh fruits and vegetables. Try to fill half of your plate at each meal with fruits and vegetables. ? Eat whole grains, such as whole wheat pasta, brown rice, or whole grain bread. Fill about one quarter of your plate with whole grains. ? Eat low-fat diary products. ? Avoid fatty cuts of meat, processed or cured meats, and poultry with skin. Fill about one quarter of your plate with lean proteins such as fish, chicken without skin, beans, eggs, and tofu. ? Avoid premade and processed foods. These tend to be higher in sodium, added sugar, and fat.  Reduce your daily sodium intake. Most people with hypertension should eat less than 1,500 mg of sodium a day.  Limit alcohol intake to no more than 1 drink a day for nonpregnant women and 2 drinks a day for men. One drink equals 12 oz of beer, 5 oz of wine, or 1 oz of hard liquor. Lifestyle  Work with your health care provider to maintain a healthy body weight, or to lose weight. Ask what  an ideal weight is for you.  Get at least 30 minutes of exercise that causes your heart to beat faster (aerobic exercise) most days of the week. Activities may include walking, swimming, or biking.  Include exercise to strengthen your muscles (resistance exercise), such as weight lifting, as part of your weekly exercise routine. Try to do these types of exercises for 30 minutes at least 3 days a week.  Do not use any products that contain nicotine or tobacco, such as cigarettes and e-cigarettes. If you need help quitting, ask your health care provider.  Control any long-term (chronic) conditions you have, such as high cholesterol or diabetes. Monitoring  Monitor your blood pressure at home as told by your health care provider. Your personal target blood pressure may vary depending on your medical conditions, your age, and other factors.  Have your blood pressure checked regularly, as often as told by your health care provider. Working with your health care provider  Review all the medicines you take with your health care provider because there may be side effects or interactions.  Talk with your health care provider about your diet, exercise habits, and other lifestyle factors that may be contributing to hypertension.  Visit your health care provider regularly. Your health care provider can help you create and adjust your plan for managing hypertension. Will I need medicine to control my blood pressure? Your health care provider may prescribe medicine if lifestyle changes are not enough to get your blood pressure under control, and if:  Your systolic blood pressure is 130 or higher.  Your diastolic blood pressure is 80 or higher. Take medicines only as told by your health care provider. Follow the directions carefully. Blood pressure medicines must be taken as prescribed. The medicine does not work as well when you skip doses. Skipping doses also puts you at risk for problems. Contact a  health care provider if:  You think you are having a reaction to medicines you have taken.  You have repeated (recurrent) headaches.  You feel dizzy.  You  have swelling in your ankles.  You have trouble with your vision. Get help right away if:  You develop a severe headache or confusion.  You have unusual weakness or numbness, or you feel faint.  You have severe pain in your chest or abdomen.  You vomit repeatedly.  You have trouble breathing. Summary  Hypertension is when the force of blood pumping through your arteries is too strong. If this condition is not controlled, it may put you at risk for serious complications.  Your personal target blood pressure may vary depending on your medical conditions, your age, and other factors. For most people, a normal blood pressure is less than 120/80.  Hypertension is managed by lifestyle changes, medicines, or both. Lifestyle changes include weight loss, eating a healthy, low-sodium diet, exercising more, and limiting alcohol. This information is not intended to replace advice given to you by your health care provider. Make sure you discuss any questions you have with your health care provider. Document Revised: 03/14/2019 Document Reviewed: 10/18/2016 Elsevier Patient Education  [place].  Preventing Hypertension Hypertension, commonly called high blood pressure, is when the force of blood pumping through the arteries is too strong. Arteries are blood vessels that carry blood from the heart throughout the body. Over time, hypertension can damage the arteries and decrease blood flow to important parts of the body, including the brain, heart, and kidneys. Often, hypertension does not cause symptoms until blood pressure is very high. For this reason, it is important to have your blood pressure checked on a regular basis. Hypertension can often be prevented with diet and lifestyle changes. If you already have hypertension, you can  control it with diet and lifestyle changes, as well as medicine. What nutrition changes can be made? Maintain a healthy diet. This includes:  Eating less salt (sodium). Ask your health care provider how much sodium is safe for you to have. The general recommendation is to consume less than 1 tsp (2,300 mg) of sodium a day. ? Do not add salt to your food. ? Choose low-sodium options when grocery shopping and eating out.  Limiting fats in your diet. You can do this by eating low-fat or fat-free dairy products and by eating less red meat.  Eating more fruits, vegetables, and whole grains. Make a goal to eat: ? 1-2 cups of fresh fruits and vegetables each day. ? 3-4 servings of whole grains each day.  Avoiding foods and beverages that have added sugars.  Eating fish that contain healthy fats (omega-3 fatty acids), such as mackerel or salmon. If you need help putting together a healthy eating plan, try the DASH diet. This diet is high in fruits, vegetables, and whole grains. It is low in sodium, red meat, and added sugars. DASH stands for Dietary Approaches to Stop Hypertension. What lifestyle changes can be made?   Lose weight if you are overweight. Losing just 3?5% of your body weight can help prevent or control hypertension. ? For example, if your present weight is 200 lb (91 kg), a loss of 3-5% of your weight means losing 6-10 lb (2.7-4.5 kg). ? Ask your health care provider to help you with a diet and exercise plan to safely lose weight.  Get enough exercise. Do at least 150 minutes of moderate-intensity exercise each week. ? You could do this in short exercise sessions several times a day, or you could do longer exercise sessions a few times a week. For example, you could take a brisk 10-minute  walk or bike ride, 3 times a day, for 5 days a week.  Find ways to reduce stress, such as exercising, meditating, listening to music, or taking a yoga class. If you need help reducing stress, ask  your health care provider.  Do not smoke. This includes e-cigarettes. Chemicals in tobacco and nicotine products raise your blood pressure each time you smoke. If you need help quitting, ask your health care provider.  Avoid alcohol. If you drink alcohol, limit alcohol intake to no more than 1 drink a day for nonpregnant women and 2 drinks a day for men. One drink equals 12 oz of beer, 5 oz of wine, or 1 oz of hard liquor. Why are these changes important? Diet and lifestyle changes can help you prevent hypertension, and they may make you feel better overall and improve your quality of life. If you have hypertension, making these changes will help you control it and help prevent major complications, such as:  Hardening and narrowing of arteries that supply blood to: ? Your heart. This can cause a heart attack. ? Your brain. This can cause a stroke. ? Your kidneys. This can cause kidney failure.  Stress on your heart muscle, which can cause heart failure. What can I do to lower my risk?  Work with your health care provider to make a hypertension prevention plan that works for you. Follow your plan and keep all follow-up visits as told by your health care provider.  Learn how to check your blood pressure at home. Make sure that you know your personal target blood pressure, as told by your health care provider. How is this treated? In addition to diet and lifestyle changes, your health care provider may recommend medicines to help lower your blood pressure. You may need to try a few different medicines to find what works best for you. You also may need to take more than one medicine. Take over-the-counter and prescription medicines only as told by your health care provider. Where to find support Your health care provider can help you prevent hypertension and help you keep your blood pressure at a healthy level. Your local hospital or your community may also provide support services and prevention  programs. The American Heart Association offers an online support network at: CheapBootlegs.com.cy Where to find more information Learn more about hypertension from:  [place], [facility], and Blood Institute: ElectronicHangman.is  Centers for Disease Control and Prevention: https://ingram.com/  American Academy of Family Physicians: http://familydoctor.org/familydoctor/en/diseases-conditions/high-blood-pressure.printerview.all.html Learn more about the DASH diet from:  [place], Lung, and [place]: https://www.reyes.com/ Contact a health care provider if:  You think you are having a reaction to medicines you have taken.  You have recurrent headaches or feel dizzy.  You have swelling in your ankles.  You have trouble with your vision. Summary  Hypertension often does not cause any symptoms until blood pressure is very high. It is important to get your blood pressure checked regularly.  Diet and lifestyle changes are the most important steps in preventing hypertension.  By keeping your blood pressure in a healthy range, you can prevent complications like heart attack, heart failure, stroke, and kidney failure.  Work with your health care provider to make a hypertension prevention plan that works for you. This information is not intended to replace advice given to you by your health care provider. Make sure you discuss any questions you have with your health care provider. Document Revised: 03/14/2019 Document Reviewed: 07/31/2016 Elsevier Patient Education  2020 Elsevier  Inc.  Managing Your Hypertension Hypertension is commonly called high blood pressure. This is when the force of your blood pressing against the walls of your arteries is too strong. Arteries are blood vessels that carry blood from your heart throughout your body. Hypertension forces the heart to work harder  to pump blood, and may cause the arteries to become narrow or stiff. Having untreated or uncontrolled hypertension can cause heart attack, stroke, kidney disease, and other problems. What are blood pressure readings? A blood pressure reading consists of a higher number over a lower number. Ideally, your blood pressure should be below 120/80. The first ("top") number is called the systolic pressure. It is a measure of the pressure in your arteries as your heart beats. The second ("bottom") number is called the diastolic pressure. It is a measure of the pressure in your arteries as the heart relaxes. What does my blood pressure reading mean? Blood pressure is classified into four stages. Based on your blood pressure reading, your health care provider may use the following stages to determine what type of treatment you need, if any. Systolic pressure and diastolic pressure are measured in a unit called mm Hg. Normal  Systolic pressure: below 123456.  Diastolic pressure: below 80. Elevated  Systolic pressure: [id number].  Diastolic pressure: below 80. Hypertension stage 1  Systolic pressure: 0000000.  Diastolic pressure: [id number]. Hypertension stage 2  Systolic pressure: [id number] or above.  Diastolic pressure: 90 or above. What health risks are associated with hypertension? Managing your hypertension is an important responsibility. Uncontrolled hypertension can lead to:  A heart attack.  A stroke.  A weakened blood vessel (aneurysm).  Heart failure.  Kidney damage.  Eye damage.  Metabolic syndrome.  Memory and concentration problems. What changes can I make to manage my hypertension? Hypertension can be managed by making lifestyle changes and possibly by taking medicines. Your health care provider will help you make a plan to bring your blood pressure within a normal range. Eating and drinking   Eat a diet that is high in fiber and potassium, and low in salt (sodium), added sugar, and  fat. An example eating plan is called the DASH (Dietary Approaches to Stop Hypertension) diet. To eat this way: ? Eat plenty of fresh fruits and vegetables. Try to fill half of your plate at each meal with fruits and vegetables. ? Eat whole grains, such as whole wheat pasta, brown rice, or whole grain bread. Fill about one quarter of your plate with whole grains. ? Eat low-fat diary products. ? Avoid fatty cuts of meat, processed or cured meats, and poultry with skin. Fill about one quarter of your plate with lean proteins such as fish, chicken without skin, beans, eggs, and tofu. ? Avoid premade and processed foods. These tend to be higher in sodium, added sugar, and fat.  Reduce your daily sodium intake. Most people with hypertension should eat less than 1,500 mg of sodium a day.  Limit alcohol intake to no more than 1 drink a day for nonpregnant women and 2 drinks a day for men. One drink equals 12 oz of beer, 5 oz of wine, or 1 oz of hard liquor. Lifestyle  Work with your health care provider to maintain a healthy body weight, or to lose weight. Ask what an ideal weight is for you.  Get at least 30 minutes of exercise that causes your heart to beat faster (aerobic exercise) most days of the week. Activities may include walking, swimming, or biking.  Include exercise to strengthen your muscles (resistance exercise), such as weight lifting, as part of your weekly exercise routine. Try to do these types of exercises for 30 minutes at least 3 days a week.  Do not use any products that contain nicotine or tobacco, such as cigarettes and e-cigarettes. If you need help quitting, ask your health care provider.  Control any long-term (chronic) conditions you have, such as high cholesterol or diabetes. Monitoring  Monitor your blood pressure at home as told by your health care provider. Your personal target blood pressure may vary depending on your medical conditions, your age, and other  factors.  Have your blood pressure checked regularly, as often as told by your health care provider. Working with your health care provider  Review all the medicines you take with your health care provider because there may be side effects or interactions.  Talk with your health care provider about your diet, exercise habits, and other lifestyle factors that may be contributing to hypertension.  Visit your health care provider regularly. Your health care provider can help you create and adjust your plan for managing hypertension. Will I need medicine to control my blood pressure? Your health care provider may prescribe medicine if lifestyle changes are not enough to get your blood pressure under control, and if:  Your systolic blood pressure is 130 or higher.  Your diastolic blood pressure is 80 or higher. Take medicines only as told by your health care provider. Follow the directions carefully. Blood pressure medicines must be taken as prescribed. The medicine does not work as well when you skip doses. Skipping doses also puts you at risk for problems. Contact a health care provider if:  You think you are having a reaction to medicines you have taken.  You have repeated (recurrent) headaches.  You feel dizzy.  You have swelling in your ankles.  You have trouble with your vision. Get help right away if:  You develop a severe headache or confusion.  You have unusual weakness or numbness, or you feel faint.  You have severe pain in your chest or abdomen.  You vomit repeatedly.  You have trouble breathing. Summary  Hypertension is when the force of blood pumping through your arteries is too strong. If this condition is not controlled, it may put you at risk for serious complications.  Your personal target blood pressure may vary depending on your medical conditions, your age, and other factors. For most people, a normal blood pressure is less than 120/80.  Hypertension is  managed by lifestyle changes, medicines, or both. Lifestyle changes include weight loss, eating a healthy, low-sodium diet, exercising more, and limiting alcohol. This information is not intended to replace advice given to you by your health care provider. Make sure you discuss any questions you have with your health care provider. Document Revised: 03/14/2019 Document Reviewed: 10/18/2016 Elsevier Patient Education  [employer].

## 2020-03-30 NOTE — Progress Notes (Signed)
Established Patient Office Visit  Subjective:  Patient ID: Ariel Lopez, female    DOB: 1961/08/03  Age: 59 y.o. MRN: [id number]  CC:  Chief Complaint  Patient presents with  . Annual Exam    CPE, pt c/o being restless at night, also c/o feet feeling swollen and very uncomfortable at night.     HPI [name] presents for her yearly health check and follow-up of her hypertension and some other issues.  Blood pressure has been running in the upper 130s over upper 70s.  Continues to tolerate the diltiazem well.  Cardiology note and results of echocardiogram were reviewed.  Patient is having some swelling in her feet that is uncomfortable at night.  Seems to be associated with increased sodium in the diet.  It is somewhat intermittent.  Has not been sleeping well.  Tosses and turns.  He tried melatonin that led to a headache and developed tolerance with Benadryl.  Her father is not doing well.  He has been in a nursing home with progressive dementia over the last 10 years and hospice has been requested.  She and her husband are planning a trip up there soon.  Up-to-date with GYN care.  She did receive her Covid vaccine.  She has increased her exercise and is been able to lose 10 pounds!  Remains sensitive to carbohydrate intake that can lead to yeast infections and abdominal cramping.  Past Medical History:  Diagnosis Date  . Depression   . Hypertension   . Kidney stone   . Migraine   . Palpitations     Past Surgical History:  Procedure Laterality Date  . BREAST REDUCTION SURGERY  2012  . [place]  2013  . REDUCTION MAMMAPLASTY Bilateral 2012    Family History  Problem Relation Age of Onset  . Breast cancer Maternal Grandmother   . Breast cancer Paternal Grandmother   . Cervical cancer Sister   . Colon cancer Maternal Aunt   . Hypercholesterolemia Mother   . Hypertension Mother   . Hypercholesterolemia Father   . Hypertension Father     Social History    Socioeconomic History  . Marital status: Married    Spouse name: Not on file  . Number of children: Not on file  . Years of education: Not on file  . Highest education level: Not on file  Occupational History  . Not on file  Tobacco Use  . Smoking status: Former Smoker    Types: Cigarettes  . Smokeless tobacco: Never Used  . Tobacco comment: SOCIAL SMOKER COLLEGE   Substance and Sexual Activity  . Alcohol use: Yes    Alcohol/week: 0.0 standard drinks    Comment: socailly   . Drug use: No  . Sexual activity: Yes    Partners: Male    Comment: 1ST INTERCOURSE- 21, PARTNERS-  6, MARRIED- 33 YRS   Other Topics Concern  . Not on file  Social History Narrative  . Not on file   Social Determinants of Health   Financial Resource Strain:   . Difficulty of Paying Living Expenses:   Food Insecurity:   . Worried About Charity fundraiser in the Last Year:   . Arboriculturist in the Last Year:   Transportation Needs:   . Film/video editor (Medical):   Marland Kitchen Lack of Transportation (Non-Medical):   Physical Activity:   . Days of Exercise per Week:   . Minutes of Exercise per Session:   Stress:   .  Feeling of Stress :   Social Connections:   . Frequency of Communication with Friends and Family:   . Frequency of Social Gatherings with Friends and Family:   . Attends Religious Services:   . Active Member of Clubs or Organizations:   . Attends Archivist Meetings:   Marland Kitchen Marital Status:   Intimate Partner Violence:   . Fear of Current or Ex-Partner:   . Emotionally Abused:   Marland Kitchen Physically Abused:   . Sexually Abused:     Outpatient Medications Prior to Visit  Medication Sig Dispense Refill  . acetaminophen (TYLENOL) 500 MG tablet Take 1,000 mg every 6 (six) hours as needed by mouth for moderate pain.    . Cholecalciferol (VITAMIN D PO) Take 1 tablet by mouth daily.     Marland Kitchen MAGNESIUM PO Take 1 tablet by mouth daily.    . Probiotic Product (PROBIOTIC PO) Take 1 tablet  by mouth daily.    . progesterone (PROMETRIUM) 100 MG capsule     . diltiazem (DILT-XR) 180 MG 24 hr capsule TAKE 1 CAPSULE(180 MG) BY MOUTH DAILY 90 capsule 1  . Fluconazole (DIFLUCAN PO) Take 1 tablet by mouth daily.     No facility-administered medications prior to visit.    Allergies  Allergen Reactions  . Gluten Meal Nausea Only    headache  . Lac Bovis Nausea Only    headache  . Peanut-Containing Drug Products   . Wheat Bran Nausea And Vomiting    headache    ROS Review of Systems  Constitutional: Negative.   HENT: Negative.   Eyes: Negative for photophobia and visual disturbance.  Respiratory: Negative.   Cardiovascular: Positive for leg swelling.  Gastrointestinal: Negative.   Endocrine: Negative for polyphagia and polyuria.  Genitourinary: Negative.   Musculoskeletal: Negative for gait problem and joint swelling.  Allergic/Immunologic: Negative for immunocompromised state.  Neurological: Negative for light-headedness and numbness.  Hematological: Does not bruise/bleed easily.  Psychiatric/Behavioral: Negative.        Depression screen [facility] 2/9 03/30/2020 01/31/2018  Decreased Interest 0 1  Down, Depressed, Hopeless 0 1  PHQ - 2 Score 0 2  Altered sleeping 1 -  Tired, decreased energy 1 -  Change in appetite 0 -  Feeling bad or failure about yourself  0 -  Trouble concentrating 0 -  Moving slowly or fidgety/restless 0 -  Suicidal thoughts 0 -  PHQ-9 Score 2 -  Difficult doing work/chores Not difficult at all -    Objective:    Physical Exam  Constitutional: She is oriented to person, place, and time. She appears well-developed and well-nourished. No distress.  HENT:  Head: Normocephalic and atraumatic.  Right Ear: External ear normal.  Left Ear: External ear normal.  Eyes: Conjunctivae are normal. Right eye exhibits no discharge. Left eye exhibits no discharge. No scleral icterus.  Neck: No JVD present. No tracheal deviation present. No thyromegaly  present.  Cardiovascular: Normal rate, regular rhythm and normal heart sounds.  Pulmonary/Chest: Effort normal and breath sounds normal. No stridor.  Musculoskeletal:        General: No edema.  Lymphadenopathy:    She has no cervical adenopathy.  Neurological: She is alert and oriented to person, place, and time.  Skin: Skin is warm and dry. She is not diaphoretic.  Psychiatric: She has a normal mood and affect. Her behavior is normal.    BP (!) 152/80   Pulse 80   Temp (!) 97.2 F (36.2 C) (Tympanic)  Ht 5\' 1"  (1.549 m) Comment: 5 1 1/2inches  Wt 152 lb 12.8 oz (69.3 kg)   SpO2 98%   BMI 28.87 kg/m  Wt Readings from Last 3 Encounters:  03/30/20 152 lb 12.8 oz (69.3 kg)  08/28/19 157 lb (71.2 kg)  07/31/19 160 lb (72.6 kg)     Health Maintenance Due  Topic Date Due  . HIV Screening  Never done  . COVID-19 Vaccine (1) Never done    There are no preventive care reminders to display for this patient.  Lab Results  Component Value Date   TSH 1.79 02/01/2018   Lab Results  Component Value Date   WBC 3.8 (L) 02/01/2018   HGB 15.3 (H) 02/01/2018   HCT 43.9 02/01/2018   MCV 93.5 02/01/2018   PLT 249.0 02/01/2018   Lab Results  Component Value Date   NA 141 02/01/2018   K 4.9 02/01/2018   CO2 28 02/01/2018   GLUCOSE 102 (H) 02/01/2018   BUN 10 02/01/2018   CREATININE 0.70 02/01/2018   BILITOT 0.6 02/01/2018   ALKPHOS 63 02/01/2018   AST 16 02/01/2018   ALT 17 02/01/2018   PROT 7.0 02/01/2018   ALBUMIN 4.1 02/01/2018   CALCIUM 9.9 02/01/2018   ANIONGAP 7 10/20/2017   GFR 92.15 02/01/2018   Lab Results  Component Value Date   CHOL 159 02/01/2018   Lab Results  Component Value Date   HDL 63.30 02/01/2018   Lab Results  Component Value Date   LDLCALC 78 02/01/2018   Lab Results  Component Value Date   TRIG 89.0 02/01/2018   Lab Results  Component Value Date   CHOLHDL 3 02/01/2018   No results found for: HGBA1C    Assessment & Plan:    Problem List Items Addressed This Visit      Cardiovascular and Mediastinum   Essential hypertension - Primary   Relevant Medications   hydrochlorothiazide (HYDRODIURIL) 25 MG tablet   diltiazem (DILT-XR) 180 MG 24 hr capsule   Other Relevant Orders   CBC   Comprehensive metabolic panel   Urinalysis, Routine w reflex microscopic     Other   Healthcare maintenance   Relevant Orders   LDL cholesterol, direct   Lipid panel   Urinalysis, Routine w reflex microscopic   Adjustment insomnia   Relevant Medications   traZODone (DESYREL) 50 MG tablet      Meds ordered this encounter  Medications  . traZODone (DESYREL) 50 MG tablet    Sig: Take 0.5-1 tablets (25-50 mg total) by mouth at bedtime as needed for sleep.    Dispense:  30 tablet    Refill:  1  . hydrochlorothiazide (HYDRODIURIL) 25 MG tablet    Sig: Take 1 tablet (25 mg total) by mouth daily.    Dispense:  90 tablet    Refill:  1  . diltiazem (DILT-XR) 180 MG 24 hr capsule    Sig: TAKE 1 CAPSULE(180 MG) BY MOUTH DAILY    Dispense:  90 capsule    Refill:  1    Follow-up: Return in about 6 months (around 09/29/2020), or if symptoms worsen or fail to improve, for keep checking blood pressures. .  She had had some trouble taking chlorthalidone for.  We will try HCTZ.  She will continue to monitor her blood pressure while taking it.  Trial of trazodone.  Libby Maw, MD

## 2020-03-30 NOTE — Addendum Note (Signed)
Addended by: Marrion Coy on: 03/30/2020 12:00 PM   Modules accepted: Orders

## 2020-04-02 ENCOUNTER — Other Ambulatory Visit: Payer: Self-pay

## 2020-04-05 ENCOUNTER — Other Ambulatory Visit: Payer: Self-pay

## 2020-04-05 ENCOUNTER — Encounter: Payer: Self-pay | Admitting: Obstetrics & Gynecology

## 2020-04-05 ENCOUNTER — Ambulatory Visit (INDEPENDENT_AMBULATORY_CARE_PROVIDER_SITE_OTHER): Payer: 59 | Admitting: Obstetrics & Gynecology

## 2020-04-05 VITALS — BP 132/86 | Ht 61.5 in | Wt 155.0 lb

## 2020-04-05 DIAGNOSIS — Z01419 Encounter for gynecological examination (general) (routine) without abnormal findings: Secondary | ICD-10-CM | POA: Diagnosis not present

## 2020-04-05 DIAGNOSIS — E663 Overweight: Secondary | ICD-10-CM

## 2020-04-05 DIAGNOSIS — N951 Menopausal and female climacteric states: Secondary | ICD-10-CM | POA: Diagnosis not present

## 2020-04-05 MED ORDER — PROGESTERONE MICRONIZED 100 MG PO CAPS: 100.0000 mg | ORAL_CAPSULE | Freq: Every day | ORAL | 4 refills | Status: AC

## 2020-04-05 MED ORDER — ESTRADIOL 0.0375 MG/24HR TD PTTW: 1.0000 | MEDICATED_PATCH | TRANSDERMAL | 4 refills | Status: AC

## 2020-04-05 NOTE — Patient Instructions (Signed)
1. Encounter for routine gynecological examination with Papanicolaou smear of cervix Normal gynecologic exam.  Pap test November 2019 was negative, will repeat at 3 years.  Breast exam normal.  Screening mammogram Negative April 2021.  Colonoscopy 2018.  Health labs with family physician.  2. Menopausal syndrome Menopausal syndrome with severe hot flushes and night sweats, secondary insomnia.  No contraindication to hormone replacement therapy.  Risks benefits reviewed with patient.  Decision to start on estradiol patch 0.0375 twice a week and Prometrium capsule 100 mg daily at bedtime.  Prescription sent to pharmacy.  3. Overweight (BMI 25.0-29.9) Recommend a lower calorie/carb diet such as [employer].  Aerobic activities 5 times a week and light weightlifting every 2 days.  Congratulations on weight loss since last year.  Other orders - vitamin B-12 (CYANOCOBALAMIN) 500 MCG tablet; Take 500 mcg by mouth daily. - estradiol (VIVELLE-DOT) 0.0375 MG/24HR; Place 1 patch onto the skin 2 (two) times a week. - progesterone (PROMETRIUM) 100 MG capsule; Take 1 capsule (100 mg total) by mouth at bedtime.  Ariel Lopez, it was a pleasure seeing you today!

## 2020-04-05 NOTE — Progress Notes (Signed)
Ariel Lopez 05-17-1961 [id number]   History:    59 y.o. G2P2L2 Married  RP:  Established patient presenting for annual gyn exam   HPI: Menopause, on no HRT.  Severe hot flushes and night sweats with insomnia.  No CI to HRT.  No PMB.  No pelvic pain.  No pain with IC.  Urine/BMs wnl.  Breasts wnl.  BMI 28.81.  Physically active.  Health Labs with Fam MD. Harriet Masson 2018.   Past medical history,surgical history, family history and social history were all reviewed and documented in the EPIC chart.  Gynecologic History No LMP recorded. Patient has had an ablation.  Obstetric History OB History  Gravida Para Term Preterm AB Living  2         2  SAB TAB Ectopic Multiple Live Births      0        # Outcome Date GA Lbr Len/2nd Weight Sex Delivery Anes PTL Lv  2 Gravida           1 Gravida              ROS: A ROS was performed and pertinent positives and negatives are included in the history.  GENERAL: No fevers or chills. HEENT: No change in vision, no earache, sore throat or sinus congestion. NECK: No pain or stiffness. CARDIOVASCULAR: No chest pain or pressure. No palpitations. PULMONARY: No shortness of breath, cough or wheeze. GASTROINTESTINAL: No abdominal pain, nausea, vomiting or diarrhea, melena or bright red blood per rectum. GENITOURINARY: No urinary frequency, urgency, hesitancy or dysuria. MUSCULOSKELETAL: No joint or muscle pain, no back pain, no recent trauma. DERMATOLOGIC: No rash, no itching, no lesions. ENDOCRINE: No polyuria, polydipsia, no heat or cold intolerance. No recent change in weight. HEMATOLOGICAL: No anemia or easy bruising or bleeding. NEUROLOGIC: No headache, seizures, numbness, tingling or weakness. PSYCHIATRIC: No depression, no loss of interest in normal activity or change in sleep pattern.     Exam:   BP 132/86   Ht 5' 1.5" (1.562 m)   Wt 155 lb (70.3 kg)   BMI 28.81 kg/m   Body mass index is 28.81 kg/m.  General appearance : Well developed  well nourished female. No acute distress HEENT: Eyes: no retinal hemorrhage or exudates,  Neck supple, trachea midline, no carotid bruits, no thyroidmegaly Lungs: Clear to auscultation, no rhonchi or wheezes, or rib retractions  Heart: Regular rate and rhythm, no murmurs or gallops Breast:Examined in sitting and supine position were symmetrical in appearance, no palpable masses or tenderness,  no skin retraction, no nipple inversion, no nipple discharge, no skin discoloration, no axillary or supraclavicular lymphadenopathy Abdomen: no palpable masses or tenderness, no rebound or guarding Extremities: no edema or skin discoloration or tenderness  Pelvic: Vulva: Normal             Vagina: No gross lesions or discharge  Cervix: No gross lesions or discharge  Uterus  AV, normal size, shape and consistency, non-tender and mobile  Adnexa  Without masses or tenderness  Anus: Normal   Assessment/Plan:  59 y.o. female for annual exam   1. Encounter for routine gynecological examination with Papanicolaou smear of cervix Normal gynecologic exam.  Pap test November 2019 was negative, will repeat at 3 years.  Breast exam normal.  Screening mammogram Negative April 2021.  Colonoscopy 2018.  Health labs with family physician.  2. Menopausal syndrome Menopausal syndrome with severe hot flushes and night sweats, secondary insomnia.  No contraindication to hormone  replacement therapy.  Risks benefits reviewed with patient.  Decision to start on estradiol patch 0.0375 twice a week and Prometrium capsule 100 mg daily at bedtime.  Prescription sent to pharmacy.  3. Overweight (BMI 25.0-29.9) Recommend a lower calorie/carb diet such as [employer].  Aerobic activities 5 times a week and light weightlifting every 2 days.  Congratulations on weight loss since last year.  Other orders - vitamin B-12 (CYANOCOBALAMIN) 500 MCG tablet; Take 500 mcg by mouth daily. - estradiol (VIVELLE-DOT) 0.0375 MG/24HR; Place  1 patch onto the skin 2 (two) times a week. - progesterone (PROMETRIUM) 100 MG capsule; Take 1 capsule (100 mg total) by mouth at bedtime.  Princess Bruins MD, 2:15 PM 04/05/2020

## 2020-04-05 NOTE — Addendum Note (Signed)
Addended by: Thurnell Garbe A on: 04/05/2020 04:42 PM   Modules accepted: Orders

## 2020-04-07 LAB — PAP IG W/ RFLX HPV ASCU

## 2020-04-27 ENCOUNTER — Other Ambulatory Visit: Payer: Self-pay | Admitting: Family Medicine

## 2020-04-27 DIAGNOSIS — I10 Essential (primary) hypertension: Secondary | ICD-10-CM

## 2020-09-29 ENCOUNTER — Ambulatory Visit: Payer: 59 | Admitting: Family Medicine

## 2020-11-19 ENCOUNTER — Other Ambulatory Visit: Payer: Self-pay | Admitting: Family Medicine

## 2020-11-19 DIAGNOSIS — I10 Essential (primary) hypertension: Secondary | ICD-10-CM

## 2020-11-19 NOTE — Telephone Encounter (Signed)
Last OV 03/30/20 Last fill 03/30/20  #90/1

## 2021-02-18 IMAGING — MG DIGITAL SCREENING BILAT W/ TOMO W/ CAD
8 series · 8 of 24 positions shown · non-contrast
Comparison: Previous exam(s).

CLINICAL DATA: Screening.

EXAM:
DIGITAL SCREENING BILATERAL MAMMOGRAM WITH TOMO AND CAD

[R CC synth-2D]
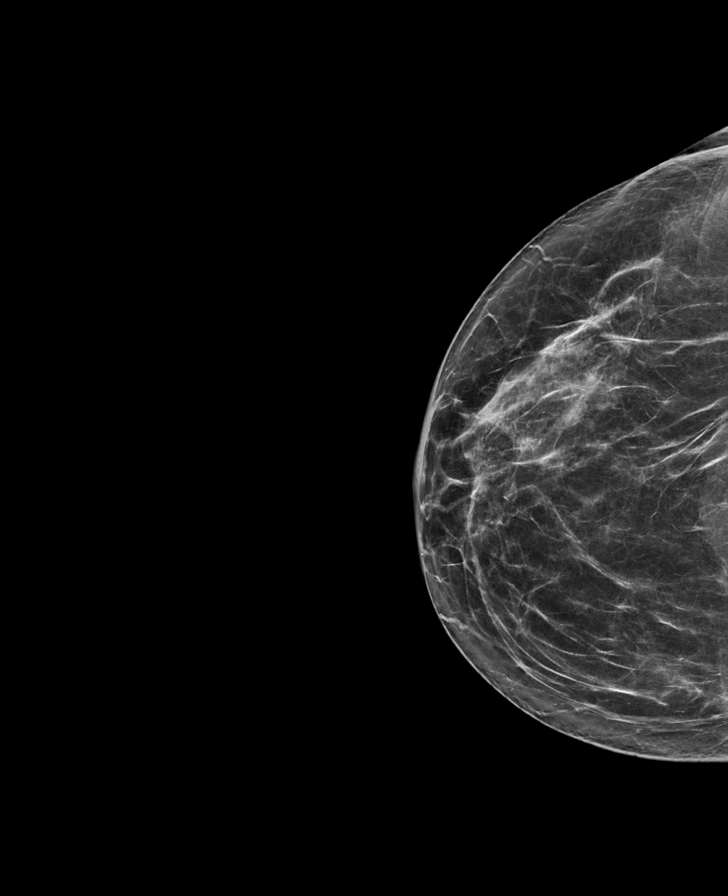

[L MLO synth-2D]
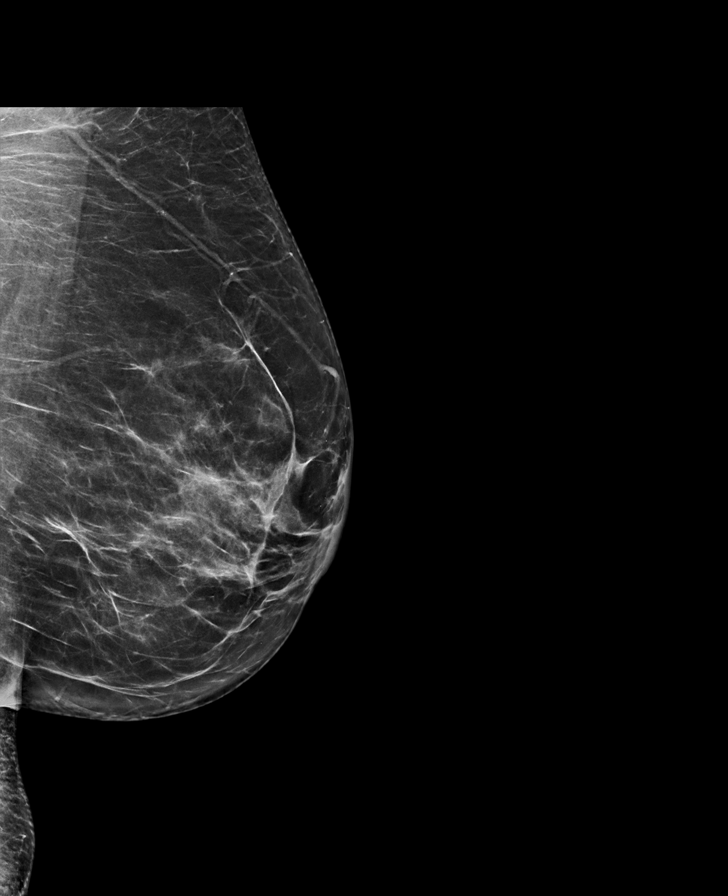

[R MLO synth-2D]
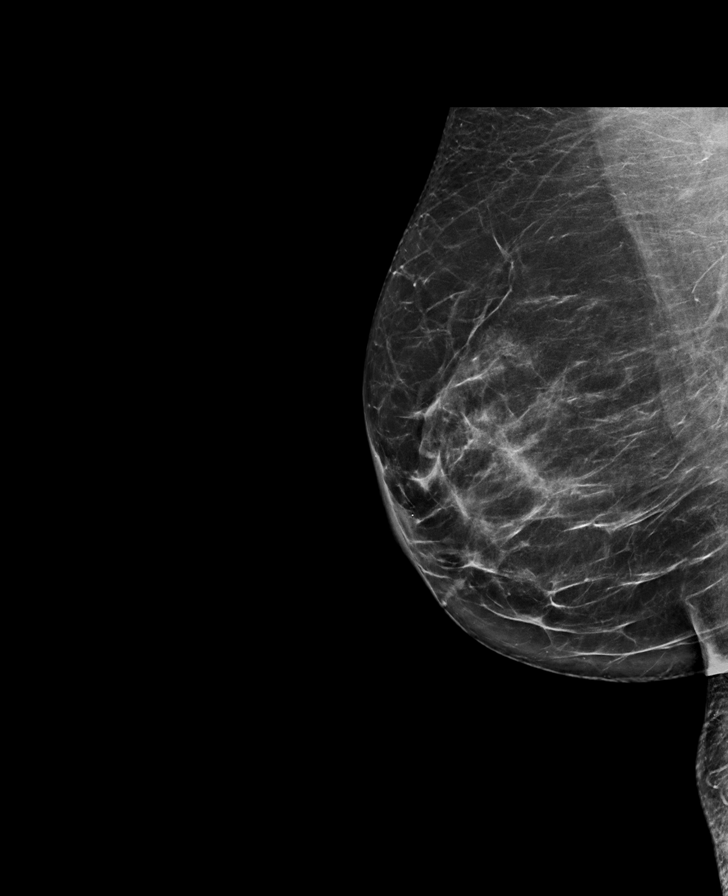

[L CC synth-2D]
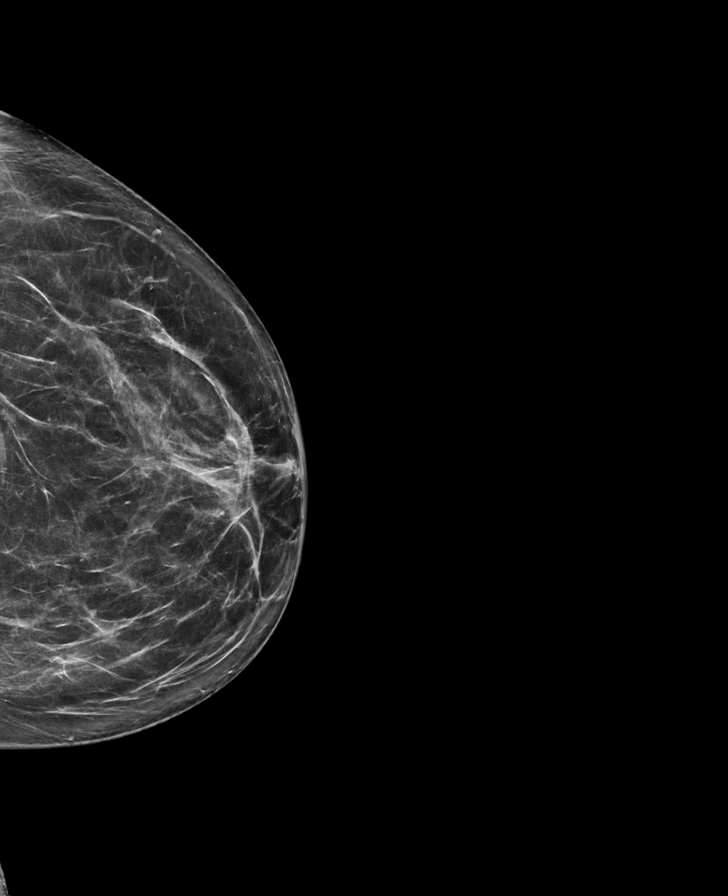

[R MLO tomo · tomo slice 39/77.0]
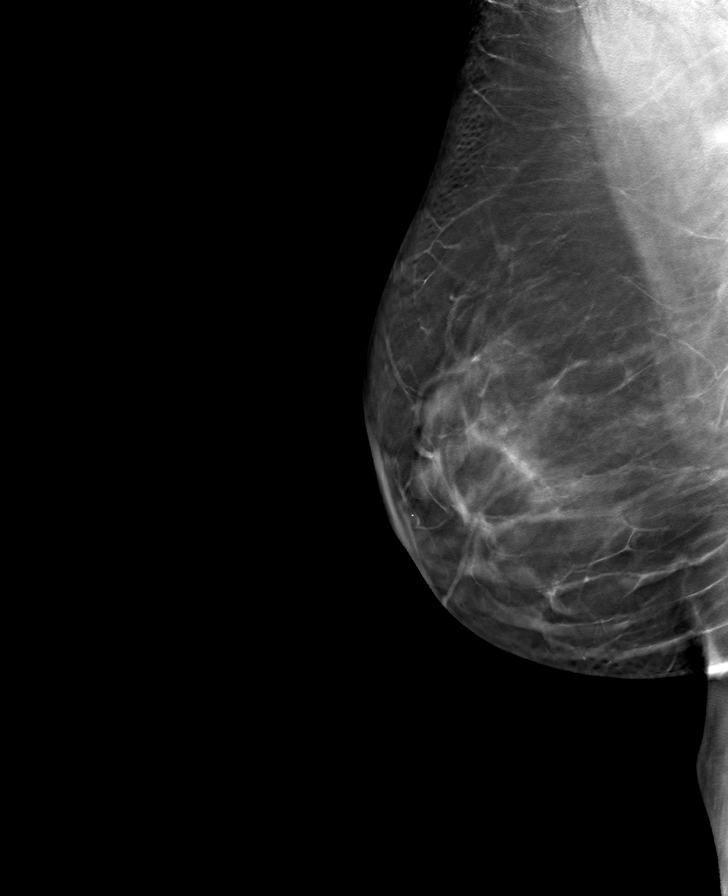

[R CC tomo · tomo slice 37/74.0]
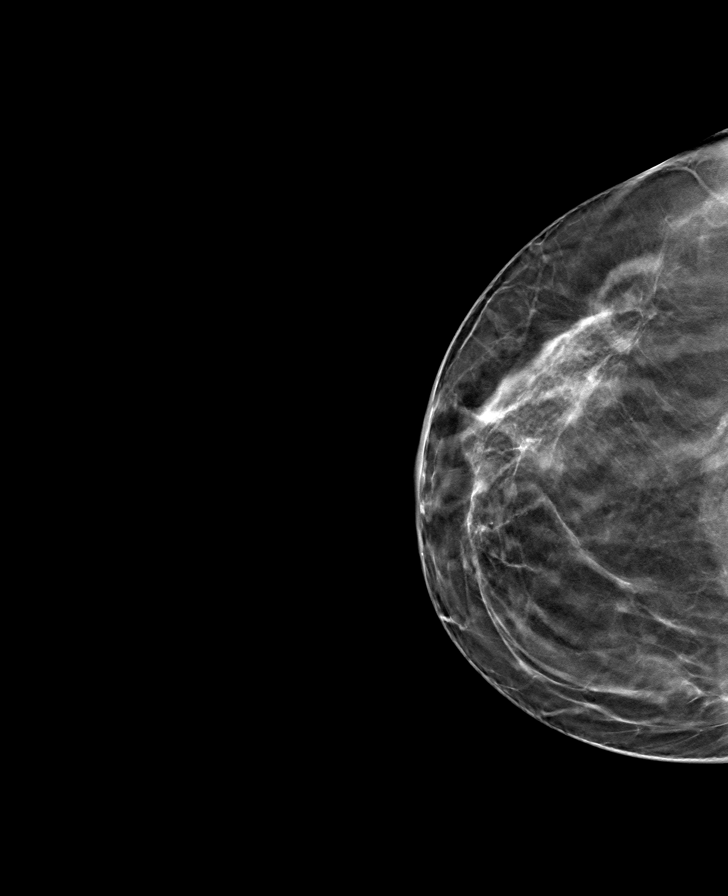

[L MLO tomo · tomo slice 37/74.0]
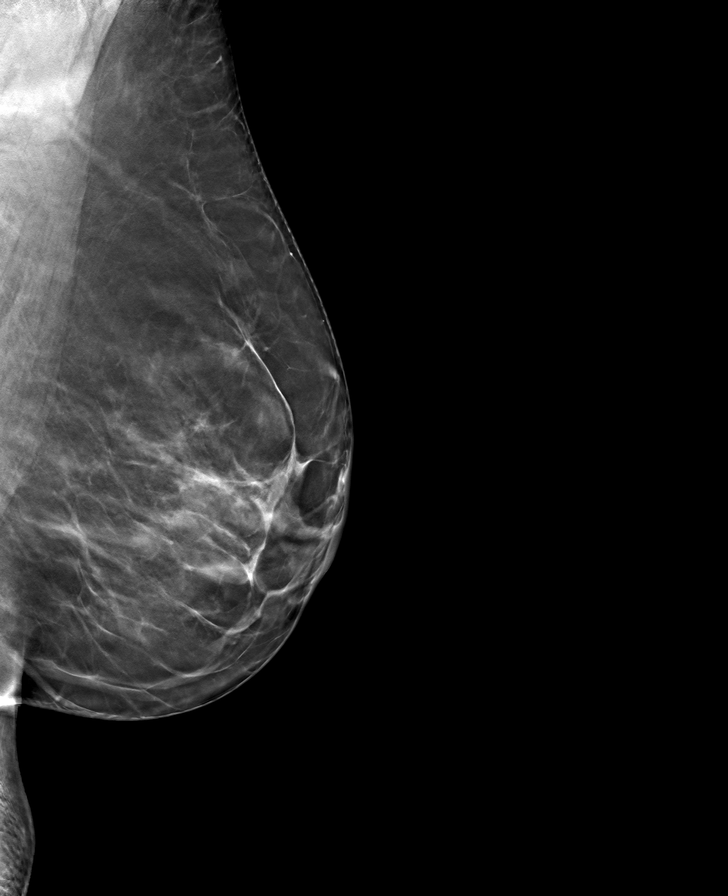

[L CC tomo · tomo slice 37/74.0]
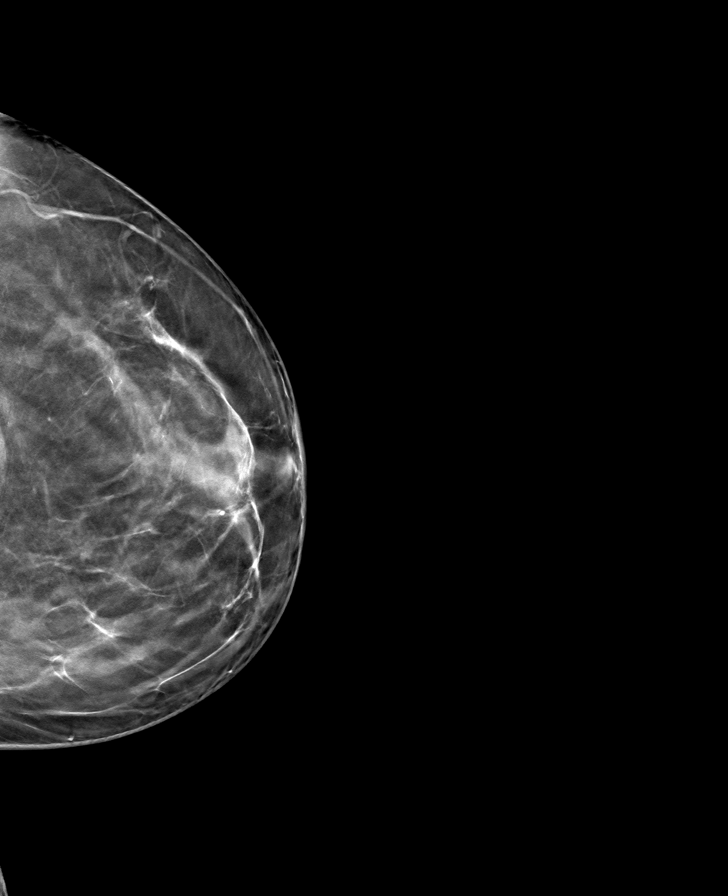

[8 of 24 positions shown; findings below may reference images not displayed]

ACR Breast Density Category b: There are scattered areas of
fibroglandular density.
FINDINGS: There are no findings suspicious for malignancy. Images were
processed with CAD.
IMPRESSION: No mammographic evidence of malignancy. A result letter of this
screening mammogram will be mailed directly to the patient.

RECOMMENDATION:
Screening mammogram in one year. (Code:[id number])

BI-RADS CATEGORY  1: Negative.

## 2021-03-01 ENCOUNTER — Encounter: Payer: Self-pay | Admitting: Family Medicine

## 2021-03-01 ENCOUNTER — Other Ambulatory Visit: Payer: Self-pay

## 2021-03-01 ENCOUNTER — Ambulatory Visit (INDEPENDENT_AMBULATORY_CARE_PROVIDER_SITE_OTHER): Payer: No Typology Code available for payment source | Admitting: Family Medicine

## 2021-03-01 VITALS — BP 148/78 | HR 84 | Temp 97.5°F | Ht 61.0 in | Wt 168.0 lb

## 2021-03-01 DIAGNOSIS — F5102 Adjustment insomnia: Secondary | ICD-10-CM | POA: Diagnosis not present

## 2021-03-01 DIAGNOSIS — R7989 Other specified abnormal findings of blood chemistry: Secondary | ICD-10-CM

## 2021-03-01 DIAGNOSIS — I10 Essential (primary) hypertension: Secondary | ICD-10-CM | POA: Diagnosis not present

## 2021-03-01 DIAGNOSIS — E559 Vitamin D deficiency, unspecified: Secondary | ICD-10-CM

## 2021-03-01 DIAGNOSIS — F419 Anxiety disorder, unspecified: Secondary | ICD-10-CM | POA: Diagnosis not present

## 2021-03-01 DIAGNOSIS — Z0000 Encounter for general adult medical examination without abnormal findings: Secondary | ICD-10-CM

## 2021-03-01 DIAGNOSIS — E876 Hypokalemia: Secondary | ICD-10-CM

## 2021-03-01 MED ORDER — ESZOPICLONE 1 MG PO TABS: 1.0000 mg | ORAL_TABLET | Freq: Every evening | ORAL | 1 refills | Status: DC | PRN

## 2021-03-01 MED ORDER — PAROXETINE HCL ER 12.5 MG PO TB24: 12.5000 mg | ORAL_TABLET | Freq: Every day | ORAL | 1 refills | Status: DC

## 2021-03-01 MED ORDER — HYDROCHLOROTHIAZIDE 25 MG PO TABS: 25.0000 mg | ORAL_TABLET | Freq: Every day | ORAL | 3 refills | Status: AC

## 2021-03-01 NOTE — Patient Instructions (Signed)
Eszopiclone tablets What is this medicine? ESZOPICLONE (es ZOE pi clone) is used to treat insomnia. This medicine helps you to fall asleep and sleep through the night. This medicine may be used for other purposes; ask your health care provider or pharmacist if you have questions. COMMON BRAND NAME(S): Lunesta What should I tell my health care provider before I take this medicine? They need to know if you have any of these conditions:  depression  history of a drug or alcohol abuse problem  liver disease  lung or breathing disease  sleep-walking, driving, eating or other activity while not fully awake after taking a sleep medicine  suicidal thoughts  an unusual or allergic reaction to eszopiclone, other medicines, foods, dyes, or preservatives  pregnant or trying to get pregnant  breast-feeding How should I use this medicine? Take this medicine by mouth with a glass of water. Follow the directions on the prescription label. It is better to take this medicine on an empty stomach and only when you are ready for bed. Do not take your medicine more often than directed. If you have been taking this medicine for several weeks and suddenly stop taking it, you may get unpleasant withdrawal symptoms. Your doctor or health care professional may want to gradually reduce the dose. Do not stop taking this medicine on your own. Always follow your doctor or health care professional's advice. Talk to your pediatrician regarding the use of this medicine in children. Special care may be needed. Overdosage: If you think you have taken too much of this medicine contact a poison control center or emergency room at once. NOTE: This medicine is only for you. Do not share this medicine with others. What if I miss a dose? This does not apply. This medicine should only be taken immediately before going to sleep. Do not take double or extra doses. What may interact with this medicine?  herbal medicines like  kava kava, melatonin, St. John's wort and valerian  lorazepam  medicines for fungal infections like ketoconazole, fluconazole, or itraconazole  olanzapine This list may not describe all possible interactions. Give your health care provider a list of all the medicines, herbs, non-prescription drugs, or dietary supplements you use. Also tell them if you smoke, drink alcohol, or use illegal drugs. Some items may interact with your medicine. What should I watch for while using this medicine? Visit your doctor or health care professional for regular checks on your progress. Keep a regular sleep schedule by going to bed at about the same time nightly. Avoid caffeine-containing drinks in the evening hours, as caffeine can cause trouble with falling asleep. Talk to your doctor if you still have trouble sleeping. After taking this medicine, you may get up out of bed and do an activity that you do not know you are doing. The next morning, you may have no memory of this. Activities include driving a car ("sleep-driving"), making and eating food, talking on the phone, sexual activity, and sleep-walking. Serious injuries have occurred. Stop the medicine and call your doctor right away if you find out you have done any of these activities. Do not take this medicine if you have used alcohol that evening. Do not take it if you have taken another medicine for sleep. The risk of doing these sleep-related activities is higher. Do not take this medicine unless you are able to stay in bed for a full night (7 to 8 hours) before you must be active again. You may have a   decrease in mental alertness the day after use, even if you feel that you are fully awake. Tell your doctor if you will need to perform activities requiring full alertness, such as driving, the next day. Do not stand or sit up quickly after taking this medicine, especially if you are an older patient. This reduces the risk of dizzy or fainting spells. If you or  your family notice any changes in your behavior, such as new or worsening depression, thoughts of harming yourself, anxiety, other unusual or disturbing thoughts, or memory loss, call your doctor right away. After you stop taking this medicine, you may have trouble falling asleep. This is called rebound insomnia. This problem usually goes away on its own after 1 or 2 nights. What side effects may I notice from receiving this medicine? Side effects that you should report to your doctor or health care professional as soon as possible:  allergic reactions like skin rash, itching or hives, swelling of the face, lips, or tongue  changes in vision  confusion  depressed mood  feeling faint or lightheaded, falls  hallucinations  problems with balance, speaking, walking  restlessness, excitability, or feelings of agitation  unusual activities while not fully awake like driving, eating, making phone calls Side effects that usually do not require medical attention (report to your doctor or health care professional if they continue or are bothersome):  dizziness, or daytime drowsiness, sometimes called a hangover effect  headache This list may not describe all possible side effects. Call your doctor for medical advice about side effects. You may report side effects to FDA at 1-800-FDA-1088. Where should I keep my medicine? Keep out of the reach of children. This medicine can be abused. Keep your medicine in a safe place to protect it from theft. Do not share this medicine with anyone. Selling or giving away this medicine is dangerous and against the law. This medicine may cause accidental overdose and death if taken by other adults, children, or pets. Mix any unused medicine with a substance like cat litter or coffee grounds. Then throw the medicine away in a sealed container like a sealed bag or a coffee can with a lid. Do not use the medicine after the expiration date. Store at room temperature  between 15 and 30 degrees C (59 and 86 degrees F). NOTE: This sheet is a summary. It may not cover all possible information. If you have questions about this medicine, talk to your doctor, pharmacist, or health care provider.  2021 Elsevier/Gold Standard (2018-05-17 11:57:05) Paroxetine Controlled-Release Tablets What is this medicine? PAROXETINE (pa ROX e teen) is used to treat depression. It may also be used to treat anxiety disorders, obsessive compulsive disorder, panic attacks, post traumatic stress, and premenstrual dysphoric disorder (PMDD). This medicine may be used for other purposes; ask your health care provider or pharmacist if you have questions. COMMON BRAND NAME(S): Paxil CR What should I tell my health care provider before I take this medicine? They need to know if you have any of these conditions:  bipolar disorder or a family history of bipolar disorder  bleeding disorders  glaucoma  heart disease  kidney disease  liver disease  low levels of sodium in the blood  seizures  suicidal thoughts, plans, or attempt; a previous suicide attempt by you or a family member  take MAOIs like Carbex, Eldepryl, Marplan, Nardil, and Parnate  take medicines that treat or prevent blood clots  thyroid disease  an unusual or allergic reaction  to paroxetine, other medicines, foods, dyes, or preservatives  pregnant or trying to get pregnant  breast-feeding How should I use this medicine? Take this medicine by mouth with a glass of water. Follow the directions on the prescription label. You can take it with or without food. Do not crush or chew this medicine. Take your medicine at regular intervals. Do not take your medicine more often than directed. Do not stop taking this medicine suddenly except upon the advice of your doctor. Stopping this medicine too quickly may cause serious side effects or your condition may worsen. A special MedGuide will be given to you by the  pharmacist with each prescription and refill. Be sure to read this information carefully each time. Talk to your pediatrician regarding the use of this medicine in children. Special care may be needed. Overdosage: If you think you have taken too much of this medicine contact a poison control center or emergency room at once. NOTE: This medicine is only for you. Do not share this medicine with others. What if I miss a dose? If you miss a dose, take it as soon as you can. If it is almost time for your next dose, take only that dose. Do not take double or extra doses. What may interact with this medicine? Do not take this medicine with any of the following medications:  linezolid  MAOIs like Carbex, Eldepryl, Marplan, Nardil, and Parnate  methylene blue (injected into a vein)  pimozide  thioridazine This medicine may also interact with the following medications:  alcohol  amphetamines  aspirin and aspirin-like medicines  atomoxetine  certain medicines for depression, anxiety, or psychotic disturbances  certain medicines for irregular heart beat like propafenone, flecainide, encainide, and quinidine  certain medicines for migraine headache like almotriptan, eletriptan, frovatriptan, naratriptan, rizatriptan, sumatriptan, zolmitriptan  cimetidine  digoxin  diuretics  fentanyl  fosamprenavir  furazolidone  isoniazid  lithium  medicines that treat or prevent blood clots like warfarin, enoxaparin, and dalteparin  medicines for sleep  NSAIDs, medicines for pain and inflammation, like ibuprofen or naproxen  phenobarbital  phenytoin  procarbazine  rasagiline  ritonavir  supplements like St. John's wort, kava kava, valerian  tamoxifen  tramadol  tryptophan This list may not describe all possible interactions. Give your health care provider a list of all the medicines, herbs, non-prescription drugs, or dietary supplements you use. Also tell them if you  smoke, drink alcohol, or use illegal drugs. Some items may interact with your medicine. What should I watch for while using this medicine? Tell your doctor if your symptoms do not get better or if they get worse. Visit your doctor or health care professional for regular checks on your progress. Because it may take several weeks to see the full effects of this medicine, it is important to continue your treatment as prescribed by your doctor. Patients and their families should watch out for new or worsening thoughts of suicide or depression. Also watch out for sudden changes in feelings such as feeling anxious, agitated, panicky, irritable, hostile, aggressive, impulsive, severely restless, overly excited and hyperactive, or not being able to sleep. If this happens, especially at the beginning of treatment or after a change in dose, call your health care professional. Dennis Bast may get drowsy or dizzy. Do not drive, use machinery, or do anything that needs mental alertness until you know how this medicine affects you. Do not stand or sit up quickly, especially if you are an older patient. This reduces the risk  of dizzy or fainting spells. Alcohol may interfere with the effect of this medicine. Avoid alcoholic drinks. Your mouth may get dry. Chewing sugarless gum or sucking hard candy, and drinking plenty of water will help. Contact your doctor if the problem does not go away or is severe. What side effects may I notice from receiving this medicine? Side effects that you should report to your doctor or health care professional as soon as possible:  allergic reactions like skin rash, itching or hives, swelling of the face, lips, or tongue  anxious  black, tarry stools  changes in vision  confusion  elevated mood, decreased need for sleep, racing thoughts, impulsive behavior  eye pain  fast, irregular heartbeat  feeling faint or lightheaded, falls  feeling agitated, angry, or  irritable  hallucination, loss of contact with reality  loss of balance or coordination  loss of memory  painful or prolonged erections  restlessness, pacing, inability to keep still  seizures  stiff muscles  suicidal thoughts or other mood changes  trouble sleeping  unusual bleeding or bruising  unusually weak or tired  vomiting Side effects that usually do not require medical attention (report to your doctor or health care professional if they continue or are bothersome):  change in appetite or weight  change in sex drive or performance  diarrhea  dizziness  dry mouth  headache  increased sweating  indigestion, nausea  tired  tremors This list may not describe all possible side effects. Call your doctor for medical advice about side effects. You may report side effects to FDA at 1-800-FDA-1088. Where should I keep my medicine? Keep out of the reach of children. Store at or below 25 degrees C (77 degrees F). Throw away any unused medicine after the expiration date. NOTE: This sheet is a summary. It may not cover all possible information. If you have questions about this medicine, talk to your doctor, pharmacist, or health care provider.  2021 Elsevier/Gold Standard (2020-10-11 13:51:03)  Managing Anxiety, Adult After being diagnosed with an anxiety disorder, you may be relieved to know why you have felt or behaved a certain way. You may also feel overwhelmed about the treatment ahead and what it will mean for your life. With care and support, you can manage this condition and recover from it. How to manage lifestyle changes Managing stress and anxiety Stress is your body's reaction to life changes and events, both good and bad. Most stress will last just a few hours, but stress can be ongoing and can lead to more than just stress. Although stress can play a major role in anxiety, it is not the same as anxiety. Stress is usually caused by something external,  such as a deadline, test, or competition. Stress normally passes after the triggering event has ended.  Anxiety is caused by something internal, such as imagining a terrible outcome or worrying that something will go wrong that will devastate you. Anxiety often does not go away even after the triggering event is over, and it can become long-term (chronic) worry. It is important to understand the differences between stress and anxiety and to manage your stress effectively so that it does not lead to an anxious response. Talk with your health care provider or a counselor to learn more about reducing anxiety and stress. He or she may suggest tension reduction techniques, such as:  Music therapy. This can include creating or listening to music that you enjoy and that inspires you.  Mindfulness-based meditation. This  involves being aware of your normal breaths while not trying to control your breathing. It can be done while sitting or walking.  Centering prayer. This involves focusing on a word, phrase, or sacred image that means something to you and brings you peace.  Deep breathing. To do this, expand your stomach and inhale slowly through your nose. Hold your breath for 3-5 seconds. Then exhale slowly, letting your stomach muscles relax.  Self-talk. This involves identifying thought patterns that lead to anxiety reactions and changing those patterns.  Muscle relaxation. This involves tensing muscles and then relaxing them. Choose a tension reduction technique that suits your lifestyle and personality. These techniques take time and practice. Set aside 5-15 minutes a day to do them. Therapists can offer counseling and training in these techniques. The training to help with anxiety may be covered by some insurance plans. Other things you can do to manage stress and anxiety include:  Keeping a stress/anxiety diary. This can help you learn what triggers your reaction and then learn ways to manage your  response.  Thinking about how you react to certain situations. You may not be able to control everything, but you can control your response.  Making time for activities that help you relax and not feeling guilty about spending your time in this way.  Visual imagery and yoga can help you stay calm and relax.   Medicines Medicines can help ease symptoms. Medicines for anxiety include:  Anti-anxiety drugs.  Antidepressants. Medicines are often used as a primary treatment for anxiety disorder. Medicines will be prescribed by a health care provider. When used together, medicines, psychotherapy, and tension reduction techniques may be the most effective treatment. Relationships Relationships can play a big part in helping you recover. Try to spend more time connecting with trusted friends and family members. Consider going to couples counseling, taking family education classes, or going to family therapy. Therapy can help you and others better understand your condition. How to recognize changes in your anxiety Everyone responds differently to treatment for anxiety. Recovery from anxiety happens when symptoms decrease and stop interfering with your daily activities at home or work. This may mean that you will start to:  Have better concentration and focus. Worry will interfere less in your daily thinking.  Sleep better.  Be less irritable.  Have more energy.  Have improved memory. It is important to recognize when your condition is getting worse. Contact your health care provider if your symptoms interfere with home or work and you feel like your condition is not improving. Follow these instructions at home: Activity  Exercise. Most adults should do the following: ? Exercise for at least 150 minutes each week. The exercise should increase your heart rate and make you sweat (moderate-intensity exercise). ? Strengthening exercises at least twice a week.  Get the right amount and quality of  sleep. Most adults need 7-9 hours of sleep each night. Lifestyle  Eat a healthy diet that includes plenty of vegetables, fruits, whole grains, low-fat dairy products, and lean protein. Do not eat a lot of foods that are high in solid fats, added sugars, or salt.  Make choices that simplify your life.  Do not use any products that contain nicotine or tobacco, such as cigarettes, e-cigarettes, and chewing tobacco. If you need help quitting, ask your health care provider.  Avoid caffeine, alcohol, and certain over-the-counter cold medicines. These may make you feel worse. Ask your pharmacist which medicines to avoid.   General instructions  Take over-the-counter and prescription medicines only as told by your health care provider.  Keep all follow-up visits as told by your health care provider. This is important. Where to find support You can get help and support from these sources:  Self-help groups.  Online and [employer].  A trusted spiritual leader.  Couples counseling.  Family education classes.  Family therapy. Where to find more information You may find that joining a support group helps you deal with your anxiety. The following sources can help you locate counselors or support groups near you:  [place]: www.mentalhealth[place].net  Anxiety and Depression Association of [place] (ADAA): https://www.clark.net/  National Alliance on Mental Illness (NAMI): www.nami.org Contact a health care provider if you:  Have a hard time staying focused or finishing daily tasks.  Spend many hours a day feeling worried about everyday life.  Become exhausted by worry.  Start to have headaches, feel tense, or have nausea.  Urinate more than normal.  Have diarrhea. Get help right away if you have:  A racing heart and shortness of breath.  Thoughts of hurting yourself or others. If you ever feel like you may hurt yourself or others, or have thoughts about taking  your own life, get help right away. You can go to your nearest emergency department or call:  Your local emergency services (911 in the [place]  A suicide crisis helpline, such as the [place] at 8147603101. This is open 24 hours a day. Summary  Taking steps to learn and use tension reduction techniques can help calm you and help prevent triggering an anxiety reaction.  When used together, medicines, psychotherapy, and tension reduction techniques may be the most effective treatment.  Family, friends, and partners can play a big part in helping you recover from an anxiety disorder. This information is not intended to replace advice given to you by your health care provider. Make sure you discuss any questions you have with your health care provider. Document Revised: 04/22/2019 Document Reviewed: 04/22/2019 Elsevier Patient Education  [place].

## 2021-03-01 NOTE — Progress Notes (Addendum)
Established Patient Office Visit  Subjective:  Patient ID: Ariel Lopez, female    DOB: 05-Jun-1961  Age: 60 y.o. MRN: [id number]  CC:  Chief Complaint  Patient presents with  . Panic Attack    Concerns about frequent panic attacks.    HPI Ariel Lopez presents for follow-up of hypertension, palpitations, insomnia.  Husband is found a job out of state.  She is currently living here alone.  Their home is sold.  They have not found a new home.  She is experiencing increased stress.  She is woken up a couple of nights in a sweat with her heart beating rapidly.  She called 911 EMS came diagnosed her with sinus tachycardia.  There was no ST segment elevation in her EKG strips.  She has been taking her HCTZ only as needed swelling.  Looking  Past Medical History:  Diagnosis Date  . Depression   . Hypertension   . Kidney stone   . Migraine   . Palpitations     Past Surgical History:  Procedure Laterality Date  . BREAST REDUCTION SURGERY  2012  . [place]  2013  . REDUCTION MAMMAPLASTY Bilateral 2012    Family History  Problem Relation Age of Onset  . Breast cancer Maternal Grandmother   . Breast cancer Paternal Grandmother   . Cervical cancer Sister   . Colon cancer Maternal Aunt   . Hypercholesterolemia Mother   . Hypertension Mother   . Hypercholesterolemia Father   . Hypertension Father     Social History   Socioeconomic History  . Marital status: Married    Spouse name: Not on file  . Number of children: Not on file  . Years of education: Not on file  . Highest education level: Not on file  Occupational History  . Not on file  Tobacco Use  . Smoking status: Former Smoker    Types: Cigarettes  . Smokeless tobacco: Never Used  . Tobacco comment: [employer]   Vaping Use  . Vaping Use: Never used  Substance and Sexual Activity  . Alcohol use: Yes    Alcohol/week: 0.0 standard drinks    Comment: socailly   . Drug use: No  . Sexual  activity: Yes    Partners: Male    Comment: 1ST INTERCOURSE- 79, PARTNERS-  6, MARRIED- 22 YRS   Other Topics Concern  . Not on file  Social History Narrative  . Not on file   Social Determinants of Health   Financial Resource Strain: Not on file  Food Insecurity: Not on file  Transportation Needs: Not on file  Physical Activity: Not on file  Stress: Not on file  Social Connections: Not on file  Intimate Partner Violence: Not on file    Outpatient Medications Prior to Visit  Medication Sig Dispense Refill  . acetaminophen (TYLENOL) 500 MG tablet Take 1,000 mg every 6 (six) hours as needed by mouth for moderate pain.    . Cholecalciferol (VITAMIN D PO) Take 1 tablet by mouth daily.     Marland Kitchen diltiazem (DILT-XR) 180 MG 24 hr capsule TAKE 1 CAPSULE(180 MG) BY MOUTH DAILY 90 capsule 1  . MAGNESIUM PO Take 1 tablet by mouth daily.    . Probiotic Product (PROBIOTIC PO) Take 1 tablet by mouth daily.    . vitamin B-12 (CYANOCOBALAMIN) 500 MCG tablet Take 500 mcg by mouth daily.    . hydrochlorothiazide (HYDRODIURIL) 25 MG tablet TAKE 1 TABLET(25 MG) BY MOUTH DAILY 90 tablet  1  . traZODone (DESYREL) 50 MG tablet Take 0.5-1 tablets (25-50 mg total) by mouth at bedtime as needed for sleep. 30 tablet 1  . estradiol (VIVELLE-DOT) 0.0375 MG/24HR Place 1 patch onto the skin 2 (two) times a week. (Patient not taking: Reported on 03/01/2021) 24 patch 4  . progesterone (PROMETRIUM) 100 MG capsule  (Patient not taking: Reported on 03/01/2021)    . progesterone (PROMETRIUM) 100 MG capsule Take 1 capsule (100 mg total) by mouth at bedtime. (Patient not taking: Reported on 03/01/2021) 90 capsule 4   No facility-administered medications prior to visit.    Allergies  Allergen Reactions  . Gluten Meal Nausea Only    headache  . Lac Bovis Nausea Only    headache  . Peanut-Containing Drug Products   . Wheat Bran Nausea And Vomiting    headache    ROS Review of Systems  Constitutional: Negative.    HENT: Negative.   Eyes: Negative for photophobia and visual disturbance.  Respiratory: Negative.  Negative for chest tightness, shortness of breath and wheezing.   Cardiovascular: Positive for palpitations. Negative for chest pain.  Gastrointestinal: Negative.   Genitourinary: Negative.   Musculoskeletal: Negative.   Skin: Negative.   Neurological: Positive for light-headedness.  Psychiatric/Behavioral: Positive for sleep disturbance. Negative for dysphoric mood, self-injury and suicidal ideas. The patient is nervous/anxious.       Objective:    Physical Exam Vitals and nursing note reviewed.  Constitutional:      General: She is not in acute distress.    Appearance: Normal appearance. She is not ill-appearing, toxic-appearing or diaphoretic.  HENT:     Head: Normocephalic and atraumatic.     Right Ear: Tympanic membrane, ear canal and external ear normal.     Left Ear: Tympanic membrane, ear canal and external ear normal.     Mouth/Throat:     Mouth: Mucous membranes are moist.     Pharynx: Oropharynx is clear. No oropharyngeal exudate or posterior oropharyngeal erythema.  Eyes:     General: No scleral icterus.    Extraocular Movements: Extraocular movements intact.     Conjunctiva/sclera: Conjunctivae normal.     Pupils: Pupils are equal, round, and reactive to light.  Cardiovascular:     Pulses: Normal pulses.     Heart sounds: Normal heart sounds.  Pulmonary:     Effort: Pulmonary effort is normal.     Breath sounds: Normal breath sounds.  Abdominal:     General: Bowel sounds are normal.  Musculoskeletal:     Cervical back: Normal range of motion and neck supple.  Skin:    General: Skin is warm and dry.  Neurological:     Mental Status: She is alert and oriented to person, place, and time.  Psychiatric:        Mood and Affect: Mood normal.        Behavior: Behavior normal.     BP (!) 148/78   Pulse 84   Temp (!) 97.5 F (36.4 C) (Temporal)   Ht 5\' 1"   (1.549 m)   Wt 168 lb (76.2 kg)   SpO2 97%   BMI 31.74 kg/m  Wt Readings from Last 3 Encounters:  03/01/21 168 lb (76.2 kg)  04/05/20 155 lb (70.3 kg)  03/30/20 152 lb 12.8 oz (69.3 kg)     Health Maintenance Due  Topic Date Due  . HIV Screening  Never done  . INFLUENZA VACCINE  07/04/2020    There are no preventive care  reminders to display for this patient.  Lab Results  Component Value Date   TSH 2.06 03/02/2021   Lab Results  Component Value Date   WBC 4.4 03/02/2021   HGB 15.5 (H) 03/02/2021   HCT 43.7 03/02/2021   MCV 91.8 03/02/2021   PLT 288.0 03/02/2021   Lab Results  Component Value Date   NA 132 (L) 03/02/2021   K 3.4 (L) 03/02/2021   CO2 27 03/02/2021   GLUCOSE 110 (H) 03/02/2021   BUN 10 03/02/2021   CREATININE 0.62 03/02/2021   BILITOT 0.6 03/02/2021   ALKPHOS 74 03/02/2021   AST 20 03/02/2021   ALT 20 03/02/2021   PROT 7.1 03/02/2021   ALBUMIN 4.6 03/02/2021   CALCIUM 9.6 03/02/2021   ANIONGAP 7 10/20/2017   GFR 98.02 03/02/2021   Lab Results  Component Value Date   CHOL 192 03/02/2021   Lab Results  Component Value Date   HDL 76.50 03/02/2021   Lab Results  Component Value Date   LDLCALC 93 03/02/2021   Lab Results  Component Value Date   TRIG 113.0 03/02/2021   Lab Results  Component Value Date   CHOLHDL 3 03/02/2021   Lab Results  Component Value Date   HGBA1C 5.2 03/30/2020      Assessment & Plan:   Problem List Items Addressed This Visit      Cardiovascular and Mediastinum   Essential hypertension - Primary   Relevant Medications   hydrochlorothiazide (HYDRODIURIL) 25 MG tablet   Other Relevant Orders   CBC (Completed)   Comprehensive metabolic panel (Completed)     Other   Healthcare maintenance   Relevant Orders   Lipid panel (Completed)   Adjustment insomnia   Relevant Medications   eszopiclone (LUNESTA) 1 MG TABS tablet   Anxiety   Relevant Medications   PARoxetine (PAXIL CR) 12.5 MG 24 hr tablet    Vitamin D deficiency   Relevant Orders   VITAMIN D 25 Hydroxy (Vit-D Deficiency, Fractures) (Completed)   Low TSH level   Relevant Orders   TSH (Completed)   T3, free (Completed)   Hypokalemia   Relevant Orders   Potassium      Meds ordered this encounter  Medications  . hydrochlorothiazide (HYDRODIURIL) 25 MG tablet    Sig: Take 1 tablet (25 mg total) by mouth daily.    Dispense:  90 tablet    Refill:  3  . PARoxetine (PAXIL CR) 12.5 MG 24 hr tablet    Sig: Take 1 tablet (12.5 mg total) by mouth daily.    Dispense:  90 tablet    Refill:  1  . eszopiclone (LUNESTA) 1 MG TABS tablet    Sig: Take 1 tablet (1 mg total) by mouth at bedtime as needed for sleep. Take immediately before bedtime    Dispense:  30 tablet    Refill:  1    Follow-up: Return in about 6 weeks (around 04/12/2021).   Discontinue trazodone.  Will take HCTZ daily.  Continue Dilacor.  Start Paxil CR 12.5 mg daily.  Discussed dosing or time of day that works best for her.  Will use Lunesta 1 mg nightly as needed.  Discussed sleep aberration as a side effect possibly with Lunesta.  Return fasting for blood work. Libby Maw, MD   4/14 addendum: Paxil cr too expensive and 1mg  of eszopiclone not effective. Have changed to Paxil 10mg  and Lunesta 2mg .

## 2021-03-02 ENCOUNTER — Other Ambulatory Visit (INDEPENDENT_AMBULATORY_CARE_PROVIDER_SITE_OTHER): Payer: No Typology Code available for payment source

## 2021-03-02 DIAGNOSIS — R7989 Other specified abnormal findings of blood chemistry: Secondary | ICD-10-CM | POA: Diagnosis not present

## 2021-03-02 DIAGNOSIS — Z0000 Encounter for general adult medical examination without abnormal findings: Secondary | ICD-10-CM | POA: Diagnosis not present

## 2021-03-02 DIAGNOSIS — E559 Vitamin D deficiency, unspecified: Secondary | ICD-10-CM

## 2021-03-02 DIAGNOSIS — I10 Essential (primary) hypertension: Secondary | ICD-10-CM | POA: Diagnosis not present

## 2021-03-02 LAB — LIPID PANEL
Cholesterol: 192 mg/dL (ref 0–200)
HDL: 76.5 mg/dL (ref >39.00)
LDL Cholesterol: 93 mg/dL (ref 0–99)
NonHDL: 115.95
Total CHOL/HDL Ratio: 3
Triglycerides: 113 mg/dL (ref 0.0–149.0)
VLDL: 22.6 mg/dL (ref 0.0–40.0)

## 2021-03-02 LAB — COMPREHENSIVE METABOLIC PANEL
ALT: 20 U/L (ref 0–35)
AST: 20 U/L (ref 0–37)
Albumin: 4.6 g/dL (ref 3.5–5.2)
Alkaline Phosphatase: 74 U/L (ref 39–117)
BUN: 10 mg/dL (ref 6–23)
CO2: 27 mEq/L (ref 19–32)
Calcium: 9.6 mg/dL (ref 8.4–10.5)
Chloride: 96 mEq/L (ref 96–112)
Creatinine, Ser: 0.62 mg/dL (ref 0.40–1.20)
GFR: 98.02 mL/min (ref >60.00)
Glucose, Bld: 110 mg/dL — ABNORMAL HIGH (ref 70–99)
Potassium: 3.4 mEq/L — ABNORMAL LOW (ref 3.5–5.1)
Sodium: 132 mEq/L — ABNORMAL LOW (ref 135–145)
Total Bilirubin: 0.6 mg/dL (ref 0.2–1.2)
Total Protein: 7.1 g/dL (ref 6.0–8.3)

## 2021-03-02 LAB — CBC
HCT: 43.7 % (ref 36.0–46.0)
Hemoglobin: 15.5 g/dL — ABNORMAL HIGH (ref 12.0–15.0)
MCHC: 35.6 g/dL (ref 30.0–36.0)
MCV: 91.8 fl (ref 78.0–100.0)
Platelets: 288 10*3/uL (ref 150.0–400.0)
RBC: 4.76 Mil/uL (ref 3.87–5.11)
RDW: 12.5 % (ref 11.5–15.5)
WBC: 4.4 10*3/uL (ref 4.0–10.5)

## 2021-03-02 LAB — T3, FREE: T3, Free: 3.5 pg/mL (ref 2.3–4.2)

## 2021-03-02 LAB — VITAMIN D 25 HYDROXY (VIT D DEFICIENCY, FRACTURES): VITD: 108.81 ng/mL (ref 30.00–100.00)

## 2021-03-02 LAB — TSH: TSH: 2.06 u[IU]/mL (ref 0.35–4.50)

## 2021-03-02 NOTE — Progress Notes (Signed)
Per orders of Dr. Ethelene Hal pt is here for labs, pt tolerated draw well.

## 2021-03-03 ENCOUNTER — Telehealth: Payer: Self-pay | Admitting: Family Medicine

## 2021-03-03 DIAGNOSIS — E876 Hypokalemia: Secondary | ICD-10-CM | POA: Insufficient documentation

## 2021-03-03 NOTE — Telephone Encounter (Signed)
Pt would like a CB concerning her most recent lab results. Please advise at [phone].

## 2021-03-04 NOTE — Telephone Encounter (Signed)
Pt notified of labs per [place] L on 03/04/21

## 2021-03-08 ENCOUNTER — Other Ambulatory Visit: Payer: Self-pay

## 2021-03-08 ENCOUNTER — Other Ambulatory Visit (INDEPENDENT_AMBULATORY_CARE_PROVIDER_SITE_OTHER): Payer: No Typology Code available for payment source

## 2021-03-08 DIAGNOSIS — E876 Hypokalemia: Secondary | ICD-10-CM

## 2021-03-08 LAB — POTASSIUM: Potassium: 3.7 mEq/L (ref 3.5–5.1)

## 2021-03-08 NOTE — Progress Notes (Signed)
Per orders of Dr. Ethelene Hal pt is here for labs, pt tolerated labs well.

## 2021-03-15 ENCOUNTER — Telehealth: Payer: Self-pay | Admitting: Family Medicine

## 2021-03-15 DIAGNOSIS — F5102 Adjustment insomnia: Secondary | ICD-10-CM

## 2021-03-15 DIAGNOSIS — F419 Anxiety disorder, unspecified: Secondary | ICD-10-CM

## 2021-03-15 NOTE — Telephone Encounter (Signed)
Pt is wanting a cb concerning her last visit about  her eszopiclone (LUNESTA) 1 MG TABS tablet [[id number]]. Please advise at [phone].

## 2021-03-16 NOTE — Telephone Encounter (Signed)
Patient calling states that she does not feel like Ariel Lopez is working fast enough or helping much per patient she usually lay in bed after taking and it takes about 2.5 hours to kick in not sure if she could take 2 tabs at a time instead of 1. She also have changed insurance and the Paxil 12.5mg  will cost her $350 and $250 with good Rx card would like to know if there was something else she could take for anxiety. Please advise.

## 2021-03-17 MED ORDER — ESZOPICLONE 2 MG PO TABS: 2.0000 mg | ORAL_TABLET | Freq: Every evening | ORAL | 0 refills | Status: DC | PRN

## 2021-03-17 MED ORDER — PAROXETINE HCL 20 MG PO TABS: ORAL_TABLET | ORAL | 1 refills | Status: AC

## 2021-03-17 MED ORDER — PAROXETINE HCL 10 MG PO TABS: ORAL_TABLET | ORAL | 1 refills | Status: DC

## 2021-03-17 NOTE — Addendum Note (Signed)
Addended by: Jon Billings on: 03/17/2021 07:50 AM   Modules accepted: Orders

## 2021-03-17 NOTE — Telephone Encounter (Signed)
Patient aware of message below and will pick up prescriptions.

## 2021-03-17 NOTE — Telephone Encounter (Signed)
Spoke to the pharmacy,  Insurance will only pay for qty #30 for 30 days. Pharmacy will fill that and an new Rx will have to be sent in for Paroxetine 20 mg tabs.  Please review and advise.  Thanks. Dm/cma

## 2021-03-17 NOTE — Addendum Note (Signed)
Addended by: Jon Billings on: 03/17/2021 03:49 PM   Modules accepted: Orders

## 2021-03-17 NOTE — Telephone Encounter (Signed)
Have increased lunesta to 2mg  and changed to regular paxil which is much cheaper. Have sent these to pharmacy.

## 2021-03-31 ENCOUNTER — Other Ambulatory Visit: Payer: Self-pay | Admitting: Family Medicine

## 2021-03-31 DIAGNOSIS — I10 Essential (primary) hypertension: Secondary | ICD-10-CM

## 2021-04-20 ENCOUNTER — Other Ambulatory Visit: Payer: Self-pay | Admitting: Obstetrics & Gynecology

## 2021-06-03 ENCOUNTER — Other Ambulatory Visit: Payer: Self-pay | Admitting: Family Medicine

## 2021-06-03 DIAGNOSIS — F5102 Adjustment insomnia: Secondary | ICD-10-CM

## 2021-06-03 NOTE — Telephone Encounter (Signed)
Pt called to check on refill for Lunesta, she said the pharmacy said they sent in a few days ago, It needs to go to [facility] [address], [place] [address]

## 2021-06-07 MED ORDER — ESZOPICLONE 2 MG PO TABS: 2.0000 mg | ORAL_TABLET | Freq: Every evening | ORAL | 0 refills | Status: AC | PRN

## 2021-06-07 NOTE — Telephone Encounter (Signed)
Refill request for pending Rx last OV 03/01/21. Please advise
